# Patient Record
Sex: Female | Born: 1945 | Race: White | Hispanic: No | Marital: Married | Smoking: Never smoker
Health system: Southern US, Community
[De-identification: ages and names within clinical notes are randomized; demographics above are authoritative.]

## PROBLEM LIST (undated history)

## (undated) DIAGNOSIS — I499 Cardiac arrhythmia, unspecified: Secondary | ICD-10-CM

## (undated) DIAGNOSIS — K219 Gastro-esophageal reflux disease without esophagitis: Secondary | ICD-10-CM

## (undated) DIAGNOSIS — I10 Essential (primary) hypertension: Secondary | ICD-10-CM

## (undated) DIAGNOSIS — K589 Irritable bowel syndrome without diarrhea: Secondary | ICD-10-CM

## (undated) DIAGNOSIS — I2510 Atherosclerotic heart disease of native coronary artery without angina pectoris: Secondary | ICD-10-CM

## (undated) DIAGNOSIS — J189 Pneumonia, unspecified organism: Secondary | ICD-10-CM

## (undated) DIAGNOSIS — F32A Depression, unspecified: Secondary | ICD-10-CM

## (undated) DIAGNOSIS — C4491 Basal cell carcinoma of skin, unspecified: Secondary | ICD-10-CM

## (undated) DIAGNOSIS — F419 Anxiety disorder, unspecified: Secondary | ICD-10-CM

## (undated) DIAGNOSIS — M1990 Unspecified osteoarthritis, unspecified site: Secondary | ICD-10-CM

## (undated) DIAGNOSIS — H269 Unspecified cataract: Secondary | ICD-10-CM

## (undated) DIAGNOSIS — E785 Hyperlipidemia, unspecified: Secondary | ICD-10-CM

## (undated) DIAGNOSIS — E039 Hypothyroidism, unspecified: Secondary | ICD-10-CM

## (undated) DIAGNOSIS — F329 Major depressive disorder, single episode, unspecified: Secondary | ICD-10-CM

## (undated) DIAGNOSIS — Z8719 Personal history of other diseases of the digestive system: Secondary | ICD-10-CM

## (undated) HISTORY — PX: LACRIMAL DUCT PROBING: SHX1903

## (undated) HISTORY — DX: Basal cell carcinoma of skin, unspecified: C44.91

## (undated) HISTORY — PX: DENTAL SURGERY: SHX609

## (undated) HISTORY — DX: Irritable bowel syndrome, unspecified: K58.9

## (undated) HISTORY — PX: THYROIDECTOMY, PARTIAL: SHX18

## (undated) HISTORY — PX: BASAL CELL CARCINOMA EXCISION: SHX1214

## (undated) HISTORY — PX: COSMETIC SURGERY: SHX468

## (undated) HISTORY — DX: Depression, unspecified: F32.A

## (undated) HISTORY — PX: CHOLECYSTECTOMY: SHX55

## (undated) HISTORY — DX: Unspecified cataract: H26.9

## (undated) HISTORY — DX: Essential (primary) hypertension: I10

## (undated) HISTORY — DX: Hyperlipidemia, unspecified: E78.5

## (undated) HISTORY — DX: Anxiety disorder, unspecified: F41.9

## (undated) HISTORY — DX: Major depressive disorder, single episode, unspecified: F32.9

---

## 2007-12-03 ENCOUNTER — Emergency Department (HOSPITAL_COMMUNITY): Admission: EM | Admit: 2007-12-03 | Discharge: 2007-12-03 | Payer: Self-pay | Admitting: Emergency Medicine

## 2008-04-22 ENCOUNTER — Emergency Department (HOSPITAL_COMMUNITY): Admission: EM | Admit: 2008-04-22 | Discharge: 2008-04-22 | Payer: Self-pay | Admitting: Emergency Medicine

## 2009-10-24 ENCOUNTER — Encounter: Admission: RE | Admit: 2009-10-24 | Discharge: 2009-10-24 | Payer: Self-pay | Admitting: Internal Medicine

## 2009-11-03 ENCOUNTER — Encounter: Admission: RE | Admit: 2009-11-03 | Discharge: 2009-11-03 | Payer: Self-pay | Admitting: Family Medicine

## 2009-11-22 ENCOUNTER — Encounter: Admission: RE | Admit: 2009-11-22 | Discharge: 2009-11-22 | Payer: Self-pay | Admitting: Emergency Medicine

## 2009-11-22 ENCOUNTER — Other Ambulatory Visit: Admission: RE | Admit: 2009-11-22 | Discharge: 2009-11-22 | Payer: Self-pay | Admitting: Interventional Radiology

## 2009-11-30 ENCOUNTER — Encounter (INDEPENDENT_AMBULATORY_CARE_PROVIDER_SITE_OTHER): Payer: Self-pay | Admitting: *Deleted

## 2010-01-06 ENCOUNTER — Ambulatory Visit (HOSPITAL_COMMUNITY): Admission: RE | Admit: 2010-01-06 | Discharge: 2010-01-07 | Payer: Self-pay | Admitting: Otolaryngology

## 2010-01-06 ENCOUNTER — Encounter (INDEPENDENT_AMBULATORY_CARE_PROVIDER_SITE_OTHER): Payer: Self-pay | Admitting: Otolaryngology

## 2010-09-10 ENCOUNTER — Encounter: Payer: Self-pay | Admitting: Internal Medicine

## 2010-09-19 NOTE — Letter (Signed)
Summary: Referral - not able to see patient  [facility]  [address] [place], [place] [address]   Phone: [phone]  Fax: [phone]       November 30, 2009   [facility], [place] Ellamae Sia, M.D. [address] [place], [place] [address]    Re:   Alexis King DOB:  [birth year] MRN:   [id number]    Dear Dr. Merla Riches:  Thank you for your kind referral of the above patient.  We have attempted to schedule the recommended procedure Screening Colonoscopy but have not been able to schedule because:   X  The patient was not available by phone and/or has not returned our calls.  ___ The patient declined to schedule the procedure at this time.  We appreciate the referral and hope that we will have the opportunity to treat this patient in the future.    Sincerely,    [facility] [phone]

## 2010-11-06 LAB — CBC
HCT: 40.2 % (ref 36.0–46.0)
Hemoglobin: 13.9 g/dL (ref 12.0–15.0)
MCV: 93.8 fL (ref 78.0–100.0)
Platelets: 223 10*3/uL (ref 150–400)
RBC: 4.28 MIL/uL (ref 3.87–5.11)

## 2010-11-06 LAB — TSH: TSH: 4.841 u[IU]/mL — ABNORMAL HIGH (ref 0.350–4.500)

## 2010-11-06 LAB — COMPREHENSIVE METABOLIC PANEL WITH GFR
ALT: 28 U/L (ref 0–35)
AST: 28 U/L (ref 0–37)
Albumin: 4.2 g/dL (ref 3.5–5.2)
Alkaline Phosphatase: 46 U/L (ref 39–117)
BUN: 22 mg/dL (ref 6–23)
CO2: 28 meq/L (ref 19–32)
Calcium: 9.9 mg/dL (ref 8.4–10.5)
Chloride: 100 meq/L (ref 96–112)
Creatinine, Ser: 0.95 mg/dL (ref 0.4–1.2)
GFR calc Af Amer: >60 mL/min (ref >60)
GFR calc non Af Amer: 59 mL/min — ABNORMAL LOW (ref >60)
Glucose, Bld: 114 mg/dL — ABNORMAL HIGH (ref 70–99)
Potassium: 4.2 meq/L (ref 3.5–5.1)
Sodium: 138 meq/L (ref 135–145)
Total Bilirubin: 0.9 mg/dL (ref 0.3–1.2)
Total Protein: 7 g/dL (ref 6.0–8.3)

## 2010-11-06 LAB — GLUCOSE, CAPILLARY
Glucose-Capillary: 136 mg/dL — ABNORMAL HIGH (ref 70–99)
Glucose-Capillary: 174 mg/dL — ABNORMAL HIGH (ref 70–99)

## 2011-10-13 ENCOUNTER — Ambulatory Visit (INDEPENDENT_AMBULATORY_CARE_PROVIDER_SITE_OTHER): Payer: Medicare Other | Admitting: Family Medicine

## 2011-10-13 DIAGNOSIS — E789 Disorder of lipoprotein metabolism, unspecified: Secondary | ICD-10-CM

## 2011-10-13 DIAGNOSIS — E119 Type 2 diabetes mellitus without complications: Secondary | ICD-10-CM

## 2011-10-13 DIAGNOSIS — E785 Hyperlipidemia, unspecified: Secondary | ICD-10-CM

## 2011-10-13 DIAGNOSIS — I10 Essential (primary) hypertension: Secondary | ICD-10-CM

## 2011-10-13 DIAGNOSIS — E039 Hypothyroidism, unspecified: Secondary | ICD-10-CM

## 2011-10-13 LAB — POCT GLYCOSYLATED HEMOGLOBIN (HGB A1C): Hemoglobin A1C: 5.9

## 2011-10-13 NOTE — Progress Notes (Signed)
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 She comes in for a routine visit for diabetes and hypertension evaluation. She's had her eyes routinely followed by Dr. Bur[place]. She also needs her thyroid checked having had a thyroidectomy years past for multinodular goiter and is on complete thyroid replacement now. She's had diabetes for 20 years. Over the past year she's been on an Atkins loss 40 pounds then gained about 10 pounds back. She has occasional left shoulder pain otherwise no complaints.  Objective: Blood pressure 130/80, no acute distress, alert and friendly  Skin, including feet, unremarkable except for onycho-mycosis  HEENT: Unremarkable except for early cataract left eye  Fundus unremarkable  Neck unremarkable except for old thyroidectomy scar or rash chest clear  Heart regular no murmur or gallop.  Abdomen soft nontender mildly obese no HSM or masses  Extremities no edema, no lesions. Good pulses. Assessment: Stable  Plan: See med, TSH, lipids, A1c  If labs stable, refill all meds

## 2011-10-14 LAB — LIPID PANEL
Cholesterol: 218 mg/dL — ABNORMAL HIGH (ref 0–200)
HDL: 43 mg/dL (ref >39)
LDL Cholesterol: 114 mg/dL — ABNORMAL HIGH (ref 0–99)
Total CHOL/HDL Ratio: 5.1 Ratio
Triglycerides: 304 mg/dL — ABNORMAL HIGH (ref <150)
VLDL: 61 mg/dL — ABNORMAL HIGH (ref 0–40)

## 2011-10-14 LAB — COMPREHENSIVE METABOLIC PANEL
ALT: 20 U/L (ref 0–35)
AST: 26 U/L (ref 0–37)
Albumin: 4.6 g/dL (ref 3.5–5.2)
Alkaline Phosphatase: 45 U/L (ref 39–117)
BUN: 20 mg/dL (ref 6–23)
CO2: 30 mEq/L (ref 19–32)
Calcium: 10.2 mg/dL (ref 8.4–10.5)
Chloride: 103 mEq/L (ref 96–112)
Creat: 1.08 mg/dL (ref 0.50–1.10)
Glucose, Bld: 102 mg/dL — ABNORMAL HIGH (ref 70–99)
Potassium: 4.3 mEq/L (ref 3.5–5.3)
Sodium: 142 mEq/L (ref 135–145)
Total Bilirubin: 0.7 mg/dL (ref 0.3–1.2)
Total Protein: 7.2 g/dL (ref 6.0–8.3)

## 2011-10-14 LAB — TSH: TSH: 0.913 u[IU]/mL (ref 0.350–4.500)

## 2011-10-17 ENCOUNTER — Other Ambulatory Visit: Payer: Self-pay | Admitting: Family Medicine

## 2011-10-17 MED ORDER — LEVOTHYROXINE SODIUM 150 MCG PO TABS: 150.0000 ug | ORAL_TABLET | Freq: Every day | ORAL | Status: DC

## 2011-10-17 MED ORDER — LOSARTAN POTASSIUM-HCTZ 100-12.5 MG PO TABS: 1.0000 | ORAL_TABLET | Freq: Every day | ORAL | Status: DC

## 2011-10-17 NOTE — Progress Notes (Signed)
I ordered the losartan and the synthroid for a year but they printed. I left them to be faxed when we know which pharmacy to fax them to

## 2011-10-18 NOTE — Progress Notes (Signed)
LMOM for patent to CB.

## 2011-10-18 NOTE — Progress Notes (Signed)
Pt CB and also needs her Metformin ER 500 mg, two tab QD called in. Called in metformin #60 with 5 RFs and faxed Rxs for synthroid and losartan to [facility] outpt pharm

## 2011-10-24 ENCOUNTER — Other Ambulatory Visit: Payer: Self-pay | Admitting: Family Medicine

## 2011-10-24 MED ORDER — METFORMIN HCL ER 500 MG PO TB24: 500.0000 mg | ORAL_TABLET | Freq: Every day | ORAL | Status: DC

## 2011-11-13 ENCOUNTER — Other Ambulatory Visit: Payer: Self-pay | Admitting: Internal Medicine

## 2011-11-26 ENCOUNTER — Other Ambulatory Visit: Payer: Self-pay | Admitting: Physician Assistant

## 2012-02-25 ENCOUNTER — Other Ambulatory Visit: Payer: Self-pay | Admitting: Physician Assistant

## 2012-02-26 ENCOUNTER — Other Ambulatory Visit: Payer: Self-pay | Admitting: Physician Assistant

## 2012-03-31 ENCOUNTER — Ambulatory Visit (INDEPENDENT_AMBULATORY_CARE_PROVIDER_SITE_OTHER): Payer: Medicare Other | Admitting: Family Medicine

## 2012-03-31 VITALS — BP 124/94 | HR 81 | Temp 98.7°F | Resp 18 | Ht 62.0 in | Wt 221.0 lb

## 2012-03-31 DIAGNOSIS — H9209 Otalgia, unspecified ear: Secondary | ICD-10-CM

## 2012-03-31 DIAGNOSIS — R0982 Postnasal drip: Secondary | ICD-10-CM

## 2012-03-31 DIAGNOSIS — J329 Chronic sinusitis, unspecified: Secondary | ICD-10-CM

## 2012-03-31 MED ORDER — AMOXICILLIN 875 MG PO TABS: 875.0000 mg | ORAL_TABLET | Freq: Two times a day (BID) | ORAL | Status: AC

## 2012-03-31 NOTE — Progress Notes (Signed)
[facility]:  Office Visit  Chief Complaint:  Chief Complaint  Patient presents with  . Otitis Media    HPI: Alexis King is a 66 y.o. female who complains of  Right ear pain x 2 days. + sore throat. Deneis fevers, chills. + h/o sinusitis and is taking zyrtec  Past Medical History  Diagnosis Date  . Hypertension   . Hyperlipidemia   . Depression   . Diabetes mellitus   . IBS (irritable bowel syndrome)    Past Surgical History  Procedure Date  . Cholecystectomy   . Cesarean section   . Lacrimal duct probing    History   Social History  . Marital Status: Married    Spouse Name: N/A    Number of Children: N/A  . Years of Education: N/A   Social History Main Topics  . Smoking status: Never Smoker   . Smokeless tobacco: None  . Alcohol Use: No  . Drug Use: No  . Sexually Active: None   Other Topics Concern  . None   Social History Narrative  . None   Family History  Problem Relation Age of Onset  . Heart disease Father    Allergies  Allergen Reactions  . Ace Inhibitors Cough  . Sulfa Antibiotics Rash   Prior to Admission medications   Medication Sig Start Date End Date Taking? Authorizing Provider  aspirin 81 MG tablet Take 81 mg by mouth daily.   Yes Historical Provider, MD  buPROPion (WELLBUTRIN SR) 150 MG 12 hr tablet TAKE 1 TABLET BY MOUTH TWICE DAILY 11/26/11  Yes Morrell Riddle, PA-C  buPROPion (WELLBUTRIN XL) 150 MG 24 hr tablet Take 150 mg by mouth 2 (two) times daily.   Yes Historical Provider, MD  cetirizine (ZYRTEC) 10 MG tablet Take 10 mg by mouth at bedtime.   Yes Historical Provider, MD  Boris Lown Oil 500 MG CAPS Take 500 mg by mouth 2 (two) times daily.   Yes Historical Provider, MD  levothyroxine (SYNTHROID, LEVOTHROID) 150 MCG tablet Take 1 tablet (150 mcg total) by mouth daily. 10/17/11  Yes Elvina Sidle, MD  losartan-hydrochlorothiazide (HYZAAR) 100-12.5 MG per tablet Take 1 tablet by mouth daily. 10/17/11  Yes Elvina Sidle, MD  magnesium 30 MG tablet Take 30 mg by mouth daily.   Yes Historical Provider, MD  metFORMIN (GLUCOPHAGE-XR) 500 MG 24 hr tablet Take 1 tablet (500 mg total) by mouth at bedtime. 10/24/11  Yes Elvina Sidle, MD  Multiple Vitamins-Minerals (MULTIVITAMIN WITH MINERALS) tablet Take 1 tablet by mouth daily.   Yes Historical Provider, MD  naproxen (NAPROSYN) 250 MG tablet Take 400 mg by mouth 2 (two) times daily.   Yes Historical Provider, MD  pravastatin (PRAVACHOL) 40 MG tablet TAKE 1 TABLET BY MOUTH AT BEDTIME 02/25/12  Yes Pattricia Boss, PA-C  sertraline (ZOLOFT) 100 MG tablet Take 200 mg by mouth daily.   Yes Historical Provider, MD  vitamin C (ASCORBIC ACID) 500 MG tablet Take 500 mg by mouth daily.   Yes Historical Provider, MD  amoxicillin (AMOXIL) 875 MG tablet Take 1 tablet (875 mg total) by mouth 2 (two) times daily. 03/31/12 04/10/12  Nial Hawe P Macaela Presas, DO  clonazePAM (KLONOPIN) 0.5 MG tablet Take 1 mg by mouth at bedtime as needed.    Historical Provider, MD     ROS: The patient denies fevers, chills, night sweats, unintentional weight loss, chest pain, palpitations, wheezing, dyspnea on exertion, nausea, vomiting, abdominal pain, dysuria, hematuria, melena,  numbness, weakness, or tingling.   All other systems have been reviewed and were otherwise negative with the exception of those mentioned in the HPI and as above.    PHYSICAL EXAM: Filed Vitals:   03/31/12 1537  BP: 124/94  Pulse: 81  Temp: 98.7 F (37.1 C)  Resp: 18   Filed Vitals:   03/31/12 1537  Height: 5\' 2"  (1.575 m)  Weight: 221 lb (100.245 kg)   Body mass index is 40.42 kg/(m^2).  General: Alert, no acute distress. Obese HEENT:  Normocephalic, atraumatic, oropharynx patent. EOMI, PERRLA, erythematous OP, no exudates. Right TM bulging but not red Cardiovascular:  Regular rate and rhythm, no rubs murmurs or gallops.  No Carotid bruits, radial pulse intact. No pedal edema.  Respiratory: Clear to auscultation  bilaterally.  No wheezes, rales, or rhonchi.  No cyanosis, no use of accessory musculature GI: No organomegaly, abdomen is soft and non-tender, positive bowel sounds.  No masses. Skin: No rashes. Neurologic: Facial musculature symmetric. Psychiatric: Patient is appropriate throughout our interaction. Lymphatic: No cervical lymphadenopathy Musculoskeletal: Gait intact.   LABS: Results for orders placed in visit on 10/13/11  LIPID PANEL      Component Value Range   Cholesterol 218 (*) 0 - 200 mg/dL   Triglycerides 981 (*) <150 mg/dL   HDL 43  >19 mg/dL   Total CHOL/HDL Ratio 5.1     VLDL 61 (*) 0 - 40 mg/dL   LDL Cholesterol 147 (*) 0 - 99 mg/dL  TSH      Component Value Range   TSH 0.913  0.350 - 4.500 uIU/mL  COMPREHENSIVE METABOLIC PANEL      Component Value Range   Sodium 142  135 - 145 mEq/L   Potassium 4.3  3.5 - 5.3 mEq/L   Chloride 103  96 - 112 mEq/L   CO2 30  19 - 32 mEq/L   Glucose, Bld 102 (*) 70 - 99 mg/dL   BUN 20  6 - 23 mg/dL   Creat 8.29  5.62 - 1.30 mg/dL   Total Bilirubin 0.7  0.3 - 1.2 mg/dL   Alkaline Phosphatase 45  39 - 117 U/L   AST 26  0 - 37 U/L   ALT 20  0 - 35 U/L   Total Protein 7.2  6.0 - 8.3 g/dL   Albumin 4.6  3.5 - 5.2 g/dL   Calcium 86.5  8.4 - 78.4 mg/dL  POCT GLYCOSYLATED HEMOGLOBIN (HGB A1C)      Component Value Range   Hemoglobin A1C 5.9       EKG/XRAY:   Primary read interpreted by Dr. Conley Rolls at [facility].   ASSESSMENT/PLAN: Encounter Diagnoses  Name Primary?  . Otalgia Yes  . Sinusitis   . Post-nasal drip    Sxs treatment first. If no improvement then take Amoxacillin 875 mg BID x 10 days F/u prn    Masud Holub PHUONG, DO 03/31/2012 4:03 PM

## 2012-06-09 ENCOUNTER — Ambulatory Visit (INDEPENDENT_AMBULATORY_CARE_PROVIDER_SITE_OTHER): Payer: Medicare Other | Admitting: Emergency Medicine

## 2012-06-09 VITALS — BP 120/76 | HR 74 | Temp 98.0°F | Resp 18

## 2012-06-09 DIAGNOSIS — M5430 Sciatica, unspecified side: Secondary | ICD-10-CM

## 2012-06-09 MED ORDER — HYDROCODONE-ACETAMINOPHEN 5-325 MG PO TABS
1.0000 | ORAL_TABLET | ORAL | Status: AC | PRN
Start: 2012-06-09 — End: 2012-06-19

## 2012-06-09 NOTE — Progress Notes (Signed)
[facility] [address] [place] [address] [phone]  Date:  06/09/2012   Name:  Alexis King   DOB:  [birth year]   MRN:  [id number]  PCP:  No primary provider on file.    Chief Complaint: Back Pain   History of Present Illness:  Alexis King is a 66 y.o. very pleasant female patient who presents with the following:  History of low back pain radiating into right leg.  Most recent episode in June that passed promptly with medication.  Now has episode of right low back pain radiating into right leg associated with numbness and weakness in right lower leg started 1 1/2 weeks ago.  No history of trauma or overuse.  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  Never had MRI.  Has been taking vicodin for pain that was prescribed for prior episode of pain.  There is no problem list on file for this patient.   Past Medical History  Diagnosis Date  . Hypertension   . Hyperlipidemia   . Depression   . Diabetes mellitus   . IBS (irritable bowel syndrome)     Past Surgical History  Procedure Date  . Cholecystectomy   . Cesarean section   . Lacrimal duct probing     History  Substance Use Topics  . Smoking status: Never Smoker   . Smokeless tobacco: Not on file  . Alcohol Use: No    Family History  Problem Relation Age of Onset  . Heart disease Father     Allergies  Allergen Reactions  . Ace Inhibitors Cough  . Sulfa Antibiotics Rash    Medication list has been reviewed and updated.  Current Outpatient Prescriptions on File Prior to Visit  Medication Sig Dispense Refill  . aspirin 81 MG tablet Take 81 mg by mouth daily.      Marland Kitchen buPROPion (WELLBUTRIN SR) 150 MG 12 hr tablet TAKE 1 TABLET BY MOUTH TWICE DAILY  180 tablet  PRN  . cetirizine (ZYRTEC) 10 MG tablet Take 10 mg by mouth at bedtime.      Boris Lown Oil 500 MG CAPS Take 500 mg by mouth 2 (two) times daily.      Marland Kitchen levothyroxine (SYNTHROID, LEVOTHROID) 150 MCG tablet Take  1 tablet (150 mcg total) by mouth daily.  90 tablet  3  . losartan-hydrochlorothiazide (HYZAAR) 100-12.5 MG per tablet Take 1 tablet by mouth daily.  90 tablet  3  . magnesium 30 MG tablet Take 30 mg by mouth daily.      . metFORMIN (GLUCOPHAGE-XR) 500 MG 24 hr tablet Take 1 tablet (500 mg total) by mouth at bedtime.  90 tablet  3  . Multiple Vitamins-Minerals (MULTIVITAMIN WITH MINERALS) tablet Take 1 tablet by mouth daily.      . naproxen (NAPROSYN) 250 MG tablet Take 400 mg by mouth 2 (two) times daily.      . pravastatin (PRAVACHOL) 40 MG tablet TAKE 1 TABLET BY MOUTH AT BEDTIME  90 tablet  0  . sertraline (ZOLOFT) 100 MG tablet Take 200 mg by mouth daily.      Marland Kitchen buPROPion (WELLBUTRIN XL) 150 MG 24 hr tablet Take 150 mg by mouth 2 (two) times daily.      . clonazePAM (KLONOPIN) 0.5 MG tablet Take 1 mg by mouth at bedtime as needed.      . vitamin C (ASCORBIC ACID) 500 MG tablet Take 500 mg by mouth daily.  Review of Systems:  As per HPI, otherwise negative.    Physical Examination: Filed Vitals:   06/09/12 1231  BP: 120/76  Pulse: 74  Temp: 98 F (36.7 C)  Resp: 18   There were no vitals filed for this visit. There is no height or weight on file to calculate BMI. Ideal Body Weight:     GEN: WDWN, NAD, Non-toxic, Alert & Oriented x 3 HEENT: Atraumatic, Normocephalic.  Ears and Nose: No external deformity. EXTR: No clubbing/cyanosis/edema NEURO: Antalgic gait favoring right leg.  PSYCH: Normally interactive. Conversant. Not depressed or anxious appearing.  Calm demeanor.  BACK:  Tender right sciatic notch.  Neuro grossly intact with minimal weakness in right leg  Assessment and Plan: Sciatic neuritis MRI Hydrocodone Follow up after MRI  Carmelina Dane, MD  I have reviewed and agree with documentation. Robert P. Merla Riches, M.D.

## 2012-06-10 ENCOUNTER — Other Ambulatory Visit: Payer: Self-pay | Admitting: Physician Assistant

## 2012-06-13 ENCOUNTER — Ambulatory Visit
Admission: RE | Admit: 2012-06-13 | Discharge: 2012-06-13 | Disposition: A | Discharge status: Home / Self Care | Payer: Self-pay | Source: Ambulatory Visit | Attending: Emergency Medicine | Admitting: Emergency Medicine

## 2012-06-13 DIAGNOSIS — M5430 Sciatica, unspecified side: Secondary | ICD-10-CM

## 2012-06-19 ENCOUNTER — Telehealth: Payer: Self-pay | Admitting: Radiology

## 2012-06-19 DIAGNOSIS — M47816 Spondylosis without myelopathy or radiculopathy, lumbar region: Secondary | ICD-10-CM

## 2012-06-19 NOTE — Telephone Encounter (Signed)
I have advised patient of MRI results and have put in referral for Neurosurgeon.

## 2012-10-10 ENCOUNTER — Telehealth: Payer: Self-pay

## 2012-10-10 NOTE — Telephone Encounter (Signed)
Patient states she is out her synthroid and her pharmacy has sent requests already we have not responded. They did give her a 3 day supply to get her through the weekend but she needs more. She does have an appointment scheduled with Dr. Merla Riches for a CPE on March 5.  Please call her back on home # at [phone].

## 2012-10-11 MED ORDER — LEVOTHYROXINE SODIUM 150 MCG PO TABS: 150.0000 ug | ORAL_TABLET | Freq: Every day | ORAL | Status: DC

## 2012-10-11 NOTE — Telephone Encounter (Signed)
Pt notified and rx sent in 

## 2012-10-17 ENCOUNTER — Other Ambulatory Visit: Payer: Self-pay | Admitting: Family Medicine

## 2012-10-22 ENCOUNTER — Ambulatory Visit (INDEPENDENT_AMBULATORY_CARE_PROVIDER_SITE_OTHER): Payer: Medicare Other | Admitting: Internal Medicine

## 2012-10-22 ENCOUNTER — Encounter: Payer: Self-pay | Admitting: Internal Medicine

## 2012-10-22 VITALS — BP 126/72 | HR 90 | Temp 97.0°F | Resp 16 | Ht 62.0 in | Wt 225.0 lb

## 2012-10-22 DIAGNOSIS — E039 Hypothyroidism, unspecified: Secondary | ICD-10-CM | POA: Insufficient documentation

## 2012-10-22 DIAGNOSIS — E785 Hyperlipidemia, unspecified: Secondary | ICD-10-CM | POA: Insufficient documentation

## 2012-10-22 DIAGNOSIS — Z6841 Body Mass Index (BMI) 40.0 and over, adult: Secondary | ICD-10-CM

## 2012-10-22 DIAGNOSIS — I10 Essential (primary) hypertension: Secondary | ICD-10-CM

## 2012-10-22 DIAGNOSIS — E042 Nontoxic multinodular goiter: Secondary | ICD-10-CM

## 2012-10-22 DIAGNOSIS — M4716 Other spondylosis with myelopathy, lumbar region: Secondary | ICD-10-CM

## 2012-10-22 DIAGNOSIS — F329 Major depressive disorder, single episode, unspecified: Secondary | ICD-10-CM | POA: Insufficient documentation

## 2012-10-22 DIAGNOSIS — F32A Depression, unspecified: Secondary | ICD-10-CM

## 2012-10-22 DIAGNOSIS — E119 Type 2 diabetes mellitus without complications: Secondary | ICD-10-CM

## 2012-10-22 DIAGNOSIS — Z0000 Encounter for general adult medical examination without abnormal findings: Secondary | ICD-10-CM

## 2012-10-22 LAB — COMPREHENSIVE METABOLIC PANEL
ALT: 20 U/L (ref 0–35)
AST: 22 U/L (ref 0–37)
Albumin: 4.8 g/dL (ref 3.5–5.2)
CO2: 27 mEq/L (ref 19–32)
Calcium: 9.6 mg/dL (ref 8.4–10.5)
Chloride: 104 mEq/L (ref 96–112)
Potassium: 4.3 mEq/L (ref 3.5–5.3)
Total Protein: 7.5 g/dL (ref 6.0–8.3)

## 2012-10-22 LAB — CBC WITH DIFFERENTIAL/PLATELET
Basophils Absolute: 0 10*3/uL (ref 0.0–0.1)
HCT: 42.8 % (ref 36.0–46.0)
Lymphocytes Relative: 24 % (ref 12–46)
Lymphs Abs: 1.6 10*3/uL (ref 0.7–4.0)
Neutro Abs: 4.2 10*3/uL (ref 1.7–7.7)
Platelets: 239 10*3/uL (ref 150–400)
RBC: 4.79 MIL/uL (ref 3.87–5.11)
RDW: 14 % (ref 11.5–15.5)
WBC: 6.5 10*3/uL (ref 4.0–10.5)

## 2012-10-22 LAB — TSH: TSH: 2.943 u[IU]/mL (ref 0.350–4.500)

## 2012-10-22 LAB — T4, FREE: Free T4: 1.58 ng/dL (ref 0.80–1.80)

## 2012-10-22 MED ORDER — SERTRALINE HCL 100 MG PO TABS: 100.0000 mg | ORAL_TABLET | Freq: Every day | ORAL | Status: DC

## 2012-10-22 MED ORDER — FLUTICASONE PROPIONATE 50 MCG/ACT NA SUSP: 2.0000 | Freq: Every day | NASAL | Status: DC

## 2012-10-22 MED ORDER — METFORMIN HCL ER 500 MG PO TB24: 500.0000 mg | ORAL_TABLET | Freq: Every day | ORAL | Status: DC

## 2012-10-22 MED ORDER — LOSARTAN POTASSIUM-HCTZ 100-12.5 MG PO TABS: 1.0000 | ORAL_TABLET | Freq: Every day | ORAL | Status: DC

## 2012-10-22 MED ORDER — BUPROPION HCL ER (XL) 300 MG PO TB24: 300.0000 mg | ORAL_TABLET | Freq: Every day | ORAL | Status: DC

## 2012-10-22 MED ORDER — CLONAZEPAM 0.5 MG PO TABS: 1.0000 mg | ORAL_TABLET | Freq: Every evening | ORAL | Status: DC | PRN

## 2012-10-23 LAB — HEMOGLOBIN A1C
Hgb A1c MFr Bld: 6.7 % — ABNORMAL HIGH (ref <5.7)
Mean Plasma Glucose: 146 mg/dL — ABNORMAL HIGH (ref <117)

## 2012-10-24 ENCOUNTER — Encounter: Payer: Self-pay | Admitting: Internal Medicine

## 2012-10-24 NOTE — Progress Notes (Signed)
Subjective:    Patient ID: Alexis King, female    DOB: [birth year], 67 y.o.   MRN: [id number]  HPIannual exam Patient Active Problem List  Diagnosis  . HTN (hypertension)  . Other and unspecified hyperlipidemia  . Depression  . Multiple thyroid nodules  . BMI 40.0-44.9, adult  . Unspecified hypothyroidism  . DM (diabetes mellitus)  . Lumbar spondylosis with myelopathy  Current outpatient prescriptions:aspirin 81 MG tablet, Take 81 mg by mouth daily., Disp: , Rfl: ;  Biotin 10 MG TABS, Take 1 tablet by mouth daily., Disp: , Rfl: ;  cetirizine (ZYRTEC) 10 MG tablet, Take 10 mg by mouth at bedtime., Disp: , Rfl: ;  cholecalciferol (VITAMIN D) 1000 UNITS tablet, Take 2,000 Units by mouth daily., Disp: , Rfl: ;  fish oil-omega-3 fatty acids 1000 MG capsule, Take 1 g by mouth daily., Disp: , Rfl:  levothyroxine (SYNTHROID, LEVOTHROID) 150 MCG tablet, Take 1 tablet (150 mcg total) by mouth daily. Needs office visit/labs, Disp: 30 tablet, Rfl: 0;  losartan-hydrochlorothiazide (HYZAAR) 100-12.5 MG per tablet, Take 1 tablet by mouth daily., Disp: 90 tablet, Rfl: 3;  magnesium 30 MG tablet, Take 30 mg by mouth daily., Disp: , Rfl:  metFORMIN (GLUCOPHAGE-XR) 500 MG 24 hr tablet, Take 1 tablet (500 mg total) by mouth at bedtime., Disp: 90 tablet, Rfl: 3;  Multiple Vitamins-Minerals (MULTIVITAMIN WITH MINERALS) tablet, Take 1 tablet by mouth daily., Disp: , Rfl: ;  naproxen (NAPROSYN) 250 MG tablet, Take 400 mg by mouth 2 (two) times daily., Disp: , Rfl: ;  pravastatin (PRAVACHOL) 40 MG tablet, TAKE 1 TABLET BY MOUTH AT BEDTIME, Disp: 90 tablet, Rfl: PRN sertraline (ZOLOFT) 100 MG tablet, Take 1 tablet (100 mg total) by mouth daily., Disp: 90 tablet, Rfl: 3;  vitamin C (ASCORBIC ACID) 500 MG tablet, Take 500 mg by mouth daily., Disp: , Rfl: ;  buPROPion (WELLBUTRIN XL) 300 MG 24 hr tablet, Take 1 tablet (300 mg total) by mouth daily., Disp: 90 tablet, Rfl: 3;  clonazePAM (KLONOPIN) 0.5 MG tablet, Take 2  tablets (1 mg total) by mouth at bedtime as needed., Disp: 30 tablet, Rfl: 5 fluticasone (FLONASE) 50 MCG/ACT nasal spray, Place 2 sprays into the nose daily., Disp: 16 g, Rfl: 6;  Krill Oil 500 MG CAPS, Take 500 mg by mouth 2 (two) times daily., Disp: , Rfl:   No big changes in health status this year. Husband recently diagnosed with MS and taking care of him it is a struggle. He is beginning to have memory issues. She is now working part-time as a Engineer, civil (consulting) at [employer], and this is easier than her full-time schedule. She has had neck pain which is being evaluated by Dr. Viann Fish hopes to avoid surgery.  Last Pap 2012 normal/mammogram is up-to-date/immunizations up-to-date  Depression and anxiety stable on medications.    Review of Systems  Constitutional: Negative for fever, activity change, appetite change, fatigue and unexpected weight change.       Continues to be overweight  HENT: Positive for congestion, rhinorrhea, neck pain, neck stiffness, postnasal drip and sinus pressure. Negative for hearing loss, ear pain, trouble swallowing, dental problem and voice change.        On medications for allergies  Respiratory: Positive for cough. Negative for chest tightness, shortness of breath and wheezing.        Cough associated with postnasal drip  Cardiovascular: Positive for palpitations. Negative for chest pain and leg swelling.       Palpitations not associated  with any other symptom and not affecting activity level  Gastrointestinal: Negative for nausea, vomiting, abdominal pain, diarrhea, constipation and blood in stool.  Endocrine: Negative for cold intolerance.  Genitourinary: Positive for urgency and frequency. Negative for dysuria, hematuria and difficulty urinating.  Musculoskeletal: Positive for back pain and arthralgias. Negative for gait problem.       Often uses Naprosyn/have been a reaction to meloxicam= right upper quadrant pain  Skin: Negative for rash.  Allergic/Immunologic:  Positive for environmental allergies. Negative for immunocompromised state.  Neurological: Negative for dizziness, tremors, weakness and headaches.  Hematological: Negative for adenopathy. Bruises/bleeds easily.  Psychiatric/Behavioral: Positive for dysphoric mood. Negative for suicidal ideas, behavioral problems, confusion, sleep disturbance, self-injury and agitation. The patient is nervous/anxious.        Objective:   Physical Exam  Vitals reviewed. Constitutional: She is oriented to person, place, and time. No distress.  Overweight  HENT:  Right Ear: External ear normal.  Left Ear: External ear normal.  Mouth/Throat: Oropharynx is clear and moist.  Boggy turbinates  Eyes: Conjunctivae and EOM are normal. Pupils are equal, round, and reactive to light.  Neck: No JVD present. No thyromegaly present.  Decreased range of motion due to discomfort  Cardiovascular: Normal rate, regular rhythm, normal heart sounds and intact distal pulses.  Exam reveals no gallop.   No murmur heard. Pulmonary/Chest: Effort normal and breath sounds normal. No respiratory distress. She has no wheezes. She has no rales. She exhibits no tenderness.  Abdominal: Soft. Bowel sounds are normal. She exhibits no mass. There is no tenderness. There is no rebound and no guarding.  Musculoskeletal: Normal range of motion. She exhibits no edema and no tenderness.  Lymphadenopathy:    She has no cervical adenopathy.  Neurological: She is alert and oriented to person, place, and time. She has normal reflexes.  Skin: No rash noted.  Psychiatric: She has a normal mood and affect. Her behavior is normal. Judgment and thought content normal.          Assessment & Plan:  Annual exam Routine labs as well as labs for multiple problems  Results for orders placed in visit on 10/22/12  CBC WITH DIFFERENTIAL      Result Value Range   WBC 6.5  4.0 - 10.5 K/uL   RBC 4.79  3.87 - 5.11 MIL/uL   Hemoglobin 15.1 (*) 12.0 -  15.0 g/dL   HCT 16.1  09.6 - 04.5 %   MCV 89.4  78.0 - 100.0 fL   MCH 31.5  26.0 - 34.0 pg   MCHC 35.3  30.0 - 36.0 g/dL   RDW 40.9  81.1 - 91.4 %   Platelets 239  150 - 400 K/uL   Neutrophils Relative 64  43 - 77 %   Neutro Abs 4.2  1.7 - 7.7 K/uL   Lymphocytes Relative 24  12 - 46 %   Lymphs Abs 1.6  0.7 - 4.0 K/uL   Monocytes Relative 6  3 - 12 %   Monocytes Absolute 0.4  0.1 - 1.0 K/uL   Eosinophils Relative 6 (*) 0 - 5 %   Eosinophils Absolute 0.4  0.0 - 0.7 K/uL   Basophils Relative 0  0 - 1 %   Basophils Absolute 0.0  0.0 - 0.1 K/uL   Smear Review Criteria for review not met    COMPREHENSIVE METABOLIC PANEL      Result Value Range   Sodium 140  135 - 145 mEq/L  Potassium 4.3  3.5 - 5.3 mEq/L   Chloride 104  96 - 112 mEq/L   CO2 27  19 - 32 mEq/L   Glucose, Bld 112 (*) 70 - 99 mg/dL   BUN 12  6 - 23 mg/dL   Creat 1.61  0.96 - 0.45 mg/dL   Total Bilirubin 0.6  0.3 - 1.2 mg/dL   Alkaline Phosphatase 40  39 - 117 U/L   AST 22  0 - 37 U/L   ALT 20  0 - 35 U/L   Total Protein 7.5  6.0 - 8.3 g/dL   Albumin 4.8  3.5 - 5.2 g/dL   Calcium 9.6  8.4 - 40.9 mg/dL  LIPID PANEL      Result Value Range   Cholesterol 202 (*) 0 - 200 mg/dL   Triglycerides 811 (*) <150 mg/dL   HDL 45  >91 mg/dL   Total CHOL/HDL Ratio 4.5     VLDL 51 (*) 0 - 40 mg/dL   LDL Cholesterol 478 (*) 0 - 99 mg/dL  TSH      Result Value Range   TSH 2.943  0.350 - 4.500 uIU/mL  T4, FREE      Result Value Range   Free T4 1.58  0.80 - 1.80 ng/dL  HEMOGLOBIN G9F      Result Value Range   Hemoglobin A1C 6.7 (*) <5.7 %   Mean Plasma Glucose 146 (*) <117 mg/dL   Meds ordered this encounter  Medications  . cholecalciferol (VITAMIN D) 1000 UNITS tablet    Sig: Take 2,000 Units by mouth daily.  . fish oil-omega-3 fatty acids 1000 MG capsule    Sig: Take 1 g by mouth daily.  . Biotin 10 MG TABS    Sig: Take 1 tablet by mouth daily.  Marland Kitchen buPROPion (WELLBUTRIN XL) 300 MG 24 hr tablet    Sig: Take 1 tablet  (300 mg total) by mouth daily.    Dispense:  90 tablet    Refill:  3  . sertraline (ZOLOFT) 100 MG tablet    Sig: Take 1 tablet (100 mg total) by mouth daily.    Dispense:  90 tablet    Refill:  3  . clonazePAM (KLONOPIN) 0.5 MG tablet    Sig: Take 2 tablets (1 mg total) by mouth at bedtime as needed.    Dispense:  30 tablet    Refill:  5  . losartan-hydrochlorothiazide (HYZAAR) 100-12.5 MG per tablet    Sig: Take 1 tablet by mouth daily.    Dispense:  90 tablet    Refill:  3  . metFORMIN (GLUCOPHAGE-XR) 500 MG 24 hr tablet    Sig: Take 1 tablet (500 mg total) by mouth at bedtime.    Dispense:  90 tablet    Refill:  3  . fluticasone (FLONASE) 50 MCG/ACT nasal spray    Sig: Place 2 sprays into the nose daily.    Dispense:  16 g    Refill:  6   Flonase added to try to resolve cough with postnasal drip and congestion at night Followup 6 months

## 2012-11-10 ENCOUNTER — Other Ambulatory Visit: Payer: Self-pay | Admitting: Internal Medicine

## 2012-12-02 ENCOUNTER — Other Ambulatory Visit: Payer: Self-pay

## 2012-12-02 DIAGNOSIS — I10 Essential (primary) hypertension: Secondary | ICD-10-CM

## 2012-12-02 DIAGNOSIS — E119 Type 2 diabetes mellitus without complications: Secondary | ICD-10-CM

## 2012-12-02 MED ORDER — METFORMIN HCL ER 500 MG PO TB24: 500.0000 mg | ORAL_TABLET | Freq: Every day | ORAL | Status: DC

## 2012-12-02 MED ORDER — LEVOTHYROXINE SODIUM 150 MCG PO TABS: 150.0000 ug | ORAL_TABLET | Freq: Every day | ORAL | Status: DC

## 2012-12-02 MED ORDER — LOSARTAN POTASSIUM-HCTZ 100-12.5 MG PO TABS: 1.0000 | ORAL_TABLET | Freq: Every day | ORAL | Status: DC

## 2012-12-02 MED ORDER — SERTRALINE HCL 100 MG PO TABS: 100.0000 mg | ORAL_TABLET | Freq: Every day | ORAL | Status: DC

## 2012-12-02 MED ORDER — BUPROPION HCL ER (XL) 300 MG PO TB24: 300.0000 mg | ORAL_TABLET | Freq: Every day | ORAL | Status: DC

## 2012-12-02 MED ORDER — PRAVASTATIN SODIUM 40 MG PO TABS: ORAL_TABLET | ORAL | Status: DC

## 2012-12-05 ENCOUNTER — Other Ambulatory Visit: Payer: Self-pay

## 2012-12-05 MED ORDER — PRAVASTATIN SODIUM 40 MG PO TABS: ORAL_TABLET | ORAL | Status: DC

## 2012-12-18 ENCOUNTER — Other Ambulatory Visit: Payer: Self-pay

## 2012-12-18 DIAGNOSIS — E119 Type 2 diabetes mellitus without complications: Secondary | ICD-10-CM

## 2012-12-18 DIAGNOSIS — I10 Essential (primary) hypertension: Secondary | ICD-10-CM

## 2012-12-18 MED ORDER — METFORMIN HCL ER 500 MG PO TB24: 500.0000 mg | ORAL_TABLET | Freq: Every day | ORAL | Status: DC

## 2012-12-18 MED ORDER — FLUTICASONE PROPIONATE 50 MCG/ACT NA SUSP: 2.0000 | Freq: Every day | NASAL | Status: DC

## 2012-12-18 MED ORDER — LOSARTAN POTASSIUM-HCTZ 100-12.5 MG PO TABS: 1.0000 | ORAL_TABLET | Freq: Every day | ORAL | Status: DC

## 2012-12-18 MED ORDER — BUPROPION HCL ER (XL) 300 MG PO TB24: 300.0000 mg | ORAL_TABLET | Freq: Every day | ORAL | Status: DC

## 2013-03-24 ENCOUNTER — Other Ambulatory Visit: Payer: Self-pay

## 2013-03-24 MED ORDER — PRAVASTATIN SODIUM 40 MG PO TABS: ORAL_TABLET | ORAL | Status: DC

## 2013-03-24 MED ORDER — LEVOTHYROXINE SODIUM 150 MCG PO TABS: 150.0000 ug | ORAL_TABLET | Freq: Every day | ORAL | Status: DC

## 2013-04-21 ENCOUNTER — Other Ambulatory Visit: Payer: Self-pay | Admitting: Internal Medicine

## 2013-06-25 ENCOUNTER — Other Ambulatory Visit: Payer: Self-pay | Admitting: Internal Medicine

## 2013-07-03 ENCOUNTER — Ambulatory Visit (INDEPENDENT_AMBULATORY_CARE_PROVIDER_SITE_OTHER): Payer: Medicare Other | Admitting: Family Medicine

## 2013-07-03 ENCOUNTER — Ambulatory Visit
Admission: RE | Admit: 2013-07-03 | Discharge: 2013-07-03 | Disposition: A | Discharge status: Home / Self Care | Payer: Medicare Other | Source: Ambulatory Visit | Attending: Family Medicine | Admitting: Family Medicine

## 2013-07-03 VITALS — BP 132/80 | HR 72 | Temp 97.5°F | Resp 16 | Ht 62.0 in | Wt 232.6 lb

## 2013-07-03 DIAGNOSIS — R51 Headache: Secondary | ICD-10-CM

## 2013-07-03 DIAGNOSIS — E119 Type 2 diabetes mellitus without complications: Secondary | ICD-10-CM

## 2013-07-03 DIAGNOSIS — F32A Depression, unspecified: Secondary | ICD-10-CM

## 2013-07-03 DIAGNOSIS — F329 Major depressive disorder, single episode, unspecified: Secondary | ICD-10-CM

## 2013-07-03 DIAGNOSIS — IMO0001 Reserved for inherently not codable concepts without codable children: Secondary | ICD-10-CM

## 2013-07-03 LAB — POCT CBC
Granulocyte percent: 69.1 %G (ref 37–80)
HCT, POC: 48.9 % — AB (ref 37.7–47.9)
Lymph, poc: 1.6 (ref 0.6–3.4)
MCHC: 32.1 g/dL (ref 31.8–35.4)
MCV: 101 fL — AB (ref 80–97)
RBC: 4.84 M/uL (ref 4.04–5.48)

## 2013-07-03 LAB — POCT GLYCOSYLATED HEMOGLOBIN (HGB A1C): Hemoglobin A1C: 7.1

## 2013-07-03 LAB — GLUCOSE, POCT (MANUAL RESULT ENTRY): POC Glucose: 132 mg/dl — AB (ref 70–99)

## 2013-07-03 MED ORDER — HYDROCODONE-ACETAMINOPHEN 5-325 MG PO TABS: 1.0000 | ORAL_TABLET | Freq: Four times a day (QID) | ORAL | Status: DC | PRN

## 2013-07-03 NOTE — Patient Instructions (Addendum)
For your headache take hydrocodone 5 mg. every 4-6 hours if the headache is bad  Take a tizanidine at bedtime  CT scan can be done today at [facility] [address] [place]   Return if headache persists or at any time if worse

## 2013-07-03 NOTE — Progress Notes (Signed)
Subjective: 67 year old lady who comes in with a history of having had a headache for the last 3 days in the left side of her forehead. She knows of no trauma. The pain came on and has persisted. It runs about a level IV/X but occasionally shoots up to about 8/10 in severity. She has not had headaches like this. Years ago she used to have migraines, but this is not like her migraines. She has had no aura. No photophobia. No tinnitus. No dizziness. Sometimes he gets a little worse when she changes positions. She is under a good deal of stress. 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 She does not smoke. Her husband has had MS diagnosed about a year ago, and he is bad health is a stress to her. She is getting right to go tingling to visit her son next week.  She is diabetic and would like her A1c checked.  Objective: Pleasant alert oriented lady in no major acute distress. Her TMs are normal. Eyes PERRLA. EOMs intact. Throat clear. Neck supple without nodes thyromegaly. No carotid bruits. Chest clear. Heart regular without murmurs. Motor strength is symmetrical. Rapid alternating movements normal. Cranial nerves intact.  Assessment: New onset headache Type 2 diabetes Stress  Plan: CBC, sedimentation rate, glucose, hemoglobin A1c CT head  Results for orders placed in visit on 07/03/13  POCT CBC      Result Value Range   WBC 6.7  4.6 - 10.2 K/uL   Lymph, poc 1.6  0.6 - 3.4   POC LYMPH PERCENT 24.1  10 - 50 %L   MID (cbc) 0.5  0 - 0.9   POC MID % 6.8  0 - 12 %M   POC Granulocyte 4.6  2 - 6.9   Granulocyte percent 69.1  37 - 80 %G   RBC 4.84  4.04 - 5.48 M/uL   Hemoglobin 15.7  12.2 - 16.2 g/dL   HCT, POC 16.1 (*) 09.6 - 47.9 %   MCV 101.0 (*) 80 - 97 fL   MCH, POC 32.4 (*) 27 - 31.2 pg   MCHC 32.1  31.8 - 35.4 g/dL   RDW, POC 04.5     Platelet Count, POC 210  142 - 424 K/uL   MPV 8.3  0 - 99.8 fL  POCT GLYCOSYLATED HEMOGLOBIN (HGB A1C)      Result Value Range   Hemoglobin A1C 7.1    GLUCOSE, POCT (MANUAL RESULT ENTRY)      Result Value Range   POC Glucose 132 (*) 70 - 99 mg/dl

## 2013-08-14 ENCOUNTER — Ambulatory Visit (INDEPENDENT_AMBULATORY_CARE_PROVIDER_SITE_OTHER): Payer: Medicare Other | Admitting: Emergency Medicine

## 2013-08-14 VITALS — BP 140/80 | HR 79 | Temp 98.7°F | Resp 16 | Ht 61.75 in | Wt 232.0 lb

## 2013-08-14 DIAGNOSIS — J018 Other acute sinusitis: Secondary | ICD-10-CM

## 2013-08-14 DIAGNOSIS — J209 Acute bronchitis, unspecified: Secondary | ICD-10-CM

## 2013-08-14 DIAGNOSIS — J40 Bronchitis, not specified as acute or chronic: Secondary | ICD-10-CM

## 2013-08-14 MED ORDER — AMOXICILLIN-POT CLAVULANATE 875-125 MG PO TABS: 1.0000 | ORAL_TABLET | Freq: Two times a day (BID) | ORAL | Status: DC

## 2013-08-14 MED ORDER — ALBUTEROL SULFATE HFA 108 (90 BASE) MCG/ACT IN AERS: 2.0000 | INHALATION_SPRAY | RESPIRATORY_TRACT | Status: DC | PRN

## 2013-08-14 MED ORDER — PROMETHAZINE-CODEINE 6.25-10 MG/5ML PO SYRP: 5.0000 mL | ORAL_SOLUTION | Freq: Four times a day (QID) | ORAL | Status: DC | PRN

## 2013-08-14 NOTE — Patient Instructions (Signed)
Sinusitis Sinusitis is redness, soreness, and swelling (inflammation) of the paranasal sinuses. Paranasal sinuses are air pockets within the bones of your face (beneath the eyes, the middle of the forehead, or above the eyes). In healthy paranasal sinuses, mucus is able to drain out, and air is able to circulate through them by way of your nose. However, when your paranasal sinuses are inflamed, mucus and air can become trapped. This can allow bacteria and other germs to grow and cause infection. Sinusitis can develop quickly and last only a short time (acute) or continue over a long period (chronic). Sinusitis that lasts for more than 12 weeks is considered chronic.  CAUSES  Causes of sinusitis include:  Allergies.  Structural abnormalities, such as displacement of the cartilage that separates your nostrils (deviated septum), which can decrease the air flow through your nose and sinuses and affect sinus drainage.  Functional abnormalities, such as when the small hairs (cilia) that line your sinuses and help remove mucus do not work properly or are not present. SYMPTOMS  Symptoms of acute and chronic sinusitis are the same. The primary symptoms are pain and pressure around the affected sinuses. Other symptoms include:  Upper toothache.  Earache.  Headache.  Bad breath.  Decreased sense of smell and taste.  A cough, which worsens when you are lying flat.  Fatigue.  Fever.  Thick drainage from your nose, which often is green and may contain pus (purulent).  Swelling and warmth over the affected sinuses. DIAGNOSIS  Your caregiver will perform a physical exam. During the exam, your caregiver may:  Look in your nose for signs of abnormal growths in your nostrils (nasal polyps).  Tap over the affected sinus to check for signs of infection.  View the inside of your sinuses (endoscopy) with a special imaging device with a light attached (endoscope), which is inserted into your  sinuses. If your caregiver suspects that you have chronic sinusitis, one or more of the following tests may be recommended:  Allergy tests.  Nasal culture A sample of mucus is taken from your nose and sent to a lab and screened for bacteria.  Nasal cytology A sample of mucus is taken from your nose and examined by your caregiver to determine if your sinusitis is related to an allergy. TREATMENT  Most cases of acute sinusitis are related to a viral infection and will resolve on their own within 10 days. Sometimes medicines are prescribed to help relieve symptoms (pain medicine, decongestants, nasal steroid sprays, or saline sprays).  However, for sinusitis related to a bacterial infection, your caregiver will prescribe antibiotic medicines. These are medicines that will help kill the bacteria causing the infection.  Rarely, sinusitis is caused by a fungal infection. In theses cases, your caregiver will prescribe antifungal medicine. For some cases of chronic sinusitis, surgery is needed. Generally, these are cases in which sinusitis recurs more than 3 times per year, despite other treatments. HOME CARE INSTRUCTIONS   Drink plenty of water. Water helps thin the mucus so your sinuses can drain more easily.  Use a humidifier.  Inhale steam 3 to 4 times a day (for example, sit in the bathroom with the shower running).  Apply a warm, moist washcloth to your face 3 to 4 times a day, or as directed by your caregiver.  Use saline nasal sprays to help moisten and clean your sinuses.  Take over-the-counter or prescription medicines for pain, discomfort, or fever only as directed by your caregiver. SEEK IMMEDIATE MEDICAL   CARE IF:  You have increasing pain or severe headaches.  You have nausea, vomiting, or drowsiness.  You have swelling around your face.  You have vision problems.  You have a stiff neck.  You have difficulty breathing. MAKE SURE YOU:   Understand these  instructions.  Will watch your condition.  Will get help right away if you are not doing well or get worse. Document Released: 08/06/2005 Document Revised: 10/29/2011 Document Reviewed: 08/21/2011 [employer] Patient Information 2014 [employer].   Bronchitis Bronchitis is the body's way of reacting to injury and/or infection (inflammation) of the bronchi. Bronchi are the air tubes that extend from the windpipe into the lungs. If the inflammation becomes severe, it may cause shortness of breath. CAUSES  Inflammation may be caused by:  A virus.  Germs (bacteria).  Dust.  Allergens.  Pollutants and many other irritants. The cells lining the bronchial tree are covered with tiny hairs (cilia). These constantly beat upward, away from the lungs, toward the mouth. This keeps the lungs free of pollutants. When these cells become too irritated and are unable to do their job, mucus begins to develop. This causes the characteristic cough of bronchitis. The cough clears the lungs when the cilia are unable to do their job. Without either of these protective mechanisms, the mucus would settle in the lungs. Then you would develop pneumonia. Smoking is a common cause of bronchitis and can contribute to pneumonia. Stopping this habit is the single most important thing you can do to help yourself. TREATMENT   Your caregiver may prescribe an antibiotic if the cough is caused by bacteria. Also, medicines that open up your airways make it easier to breathe. Your caregiver may also recommend or prescribe an expectorant. It will loosen the mucus to be coughed up. Only take over-the-counter or prescription medicines for pain, discomfort, or fever as directed by your caregiver.  Removing whatever causes the problem (smoking, for example) is critical to preventing the problem from getting worse.  Cough suppressants may be prescribed for relief of cough symptoms.  Inhaled medicines may be prescribed to help  with symptoms now and to help prevent problems from returning.  For those with recurrent (chronic) bronchitis, there may be a need for steroid medicines. SEEK IMMEDIATE MEDICAL CARE IF:   During treatment, you develop more pus-like mucus (purulent sputum).  You have a fever.  You become progressively more ill.  You have increased difficulty breathing, wheezing, or shortness of breath. It is necessary to seek immediate medical care if you are elderly or sick from any other disease. MAKE SURE YOU:   Understand these instructions.  Will watch your condition.  Will get help right away if you are not doing well or get worse. Document Released: 08/06/2005 Document Revised: 04/08/2013 Document Reviewed: 03/31/2013 [employer] Patient Information 2014 [employer].  

## 2013-08-14 NOTE — Progress Notes (Signed)
[facility] [address], [place] [address] [phone]  Date:  08/14/2013   Name:  Alexis King   DOB:  02/15/1946   MRN:  [id number]  PCP:  No primary provider on file.    Chief Complaint: Cough   History of Present Illness:  Alexis King is a 67 y.o. very pleasant female patient who presents with the following:  Ill for 1 week with cough productive mucopurulent sputum.  Has wheezing and exertional shortness of breath. No nausea or vomiting.  No stool change or rash.  No nasal congestion or drainage but has post nasal drip.  Sore throat.  No fever or chills.  No improvement with over the counter medications or other home remedies. Denies other complaint or health concern today.   Patient Active Problem List   Diagnosis Date Noted  . HTN (hypertension) 10/22/2012  . Other and unspecified hyperlipidemia 10/22/2012  . Depression 10/22/2012  . Multiple thyroid nodules 10/22/2012  . BMI 40.0-44.9, adult 10/22/2012  . Unspecified hypothyroidism 10/22/2012  . DM (diabetes mellitus) 10/22/2012  . Lumbar spondylosis with myelopathy 10/22/2012    Past Medical History  Diagnosis Date  . Hypertension   . Hyperlipidemia   . Depression   . Diabetes mellitus   . IBS (irritable bowel syndrome)     Past Surgical History  Procedure Laterality Date  . Cholecystectomy    . Cesarean section    . Lacrimal duct probing      History  Substance Use Topics  . Smoking status: Never Smoker   . Smokeless tobacco: Not on file  . Alcohol Use: No    Family History  Problem Relation Age of Onset  . Heart disease Father     Allergies  Allergen Reactions  . Ace Inhibitors Cough  . Sulfa Antibiotics Rash    Medication list has been reviewed and updated.  Current Outpatient Prescriptions on File Prior to Visit  Medication Sig Dispense Refill  . aspirin 81 MG tablet Take 81 mg by mouth daily.      . Biotin 10 MG TABS Take 1 tablet by mouth daily.       Marland Kitchen buPROPion (WELLBUTRIN XL) 300 MG 24 hr tablet Take 1 tablet (300 mg total) by mouth daily.  90 tablet  3  . cetirizine (ZYRTEC) 10 MG tablet Take 10 mg by mouth at bedtime.      . cholecalciferol (VITAMIN D) 1000 UNITS tablet Take 2,000 Units by mouth daily.      . clonazePAM (KLONOPIN) 0.5 MG tablet TAKE 2 TABLETS BY MOUTH AT BEDTIME AS NEEDED  30 tablet  5  . fluticasone (FLONASE) 50 MCG/ACT nasal spray Place 2 sprays into the nose daily.  48 g  3  . HYDROcodone-acetaminophen (NORCO) 5-325 MG per tablet Take 1 tablet by mouth every 6 (six) hours as needed.  15 tablet  0  . Krill Oil 500 MG CAPS Take 500 mg by mouth 2 (two) times daily.      Marland Kitchen levothyroxine (SYNTHROID, LEVOTHROID) 150 MCG tablet Take 1 tablet (150 mcg total) by mouth daily before breakfast. PATIENT NEEDS OFFICE VISIT FOR ADDITIONAL REFILLS  30 tablet  0  . losartan-hydrochlorothiazide (HYZAAR) 100-12.5 MG per tablet Take 1 tablet by mouth daily.  90 tablet  3  . magnesium 30 MG tablet Take 30 mg by mouth daily.      . metFORMIN (GLUCOPHAGE-XR) 500 MG 24 hr tablet Take 1 tablet (500 mg total)  by mouth at bedtime.  90 tablet  3  . minoxidil (ROGAINE) 2 % external solution Apply topically 1 day or 1 dose.      . Multiple Vitamins-Minerals (MULTIVITAMIN WITH MINERALS) tablet Take 1 tablet by mouth daily.      . naproxen (NAPROSYN) 250 MG tablet Take 400 mg by mouth 2 (two) times daily.      . pravastatin (PRAVACHOL) 40 MG tablet Take 1 tablet (40 mg total) by mouth daily. PATIENT NEEDS OFFICE VISIT FOR ADDITIONAL REFILLS  30 tablet  0  . sertraline (ZOLOFT) 100 MG tablet Take 1 tablet (100 mg total) by mouth daily.  90 tablet  3  . vitamin C (ASCORBIC ACID) 500 MG tablet Take 500 mg by mouth daily.      . fish oil-omega-3 fatty acids 1000 MG capsule Take 1 g by mouth daily.       No current facility-administered medications on file prior to visit.    Review of Systems:  As per HPI, otherwise negative.    Physical  Examination: Filed Vitals:   08/14/13 1725  BP: 140/80  Pulse: 79  Temp: 98.7 F (37.1 C)  Resp: 16   Filed Vitals:   08/14/13 1725  Height: 5' 1.75" (1.568 m)  Weight: 232 lb (105.235 kg)   Body mass index is 42.8 kg/(m^2). Ideal Body Weight: Weight in (lb) to have BMI = 25: 135.3  GEN: WDWN, NAD, Non-toxic, A & O x 3 HEENT: Atraumatic, Normocephalic. Neck supple. No masses, No LAD.  Purulent post pharyngeal drainage. Ears and Nose: No external deformity.  Purulent nasal drainage CV: RRR, No M/G/R. No JVD. No thrill. No extra heart sounds. PULM: CTA B, scattered wheezes, no crackles, rhonchi. No retractions. No resp. distress. No accessory muscle use. ABD: S, NT, ND, +BS. No rebound. No HSM. EXTR: No c/c/e NEURO Normal gait.  PSYCH: Normally interactive. Conversant. Not depressed or anxious appearing.  Calm demeanor.    Assessment and Plan: Sinusitis Bronchitis Bronchospasm  Signed,  Phillips Odor, MD

## 2013-08-21 ENCOUNTER — Ambulatory Visit (INDEPENDENT_AMBULATORY_CARE_PROVIDER_SITE_OTHER): Payer: Medicare Other | Admitting: Emergency Medicine

## 2013-08-21 VITALS — BP 140/100 | HR 70 | Temp 99.0°F | Resp 16 | Wt 230.0 lb

## 2013-08-21 DIAGNOSIS — J209 Acute bronchitis, unspecified: Secondary | ICD-10-CM

## 2013-08-21 MED ORDER — IPRATROPIUM BROMIDE 0.02 % IN SOLN: 0.5000 mg | Freq: Once | RESPIRATORY_TRACT | Status: DC

## 2013-08-21 MED ORDER — ALBUTEROL SULFATE (2.5 MG/3ML) 0.083% IN NEBU: 5.0000 mg | INHALATION_SOLUTION | Freq: Once | RESPIRATORY_TRACT | Status: DC

## 2013-08-21 MED ORDER — LEVOFLOXACIN 500 MG PO TABS: 500.0000 mg | ORAL_TABLET | Freq: Every day | ORAL | Status: DC

## 2013-08-21 NOTE — Progress Notes (Signed)
[facility] [address] [place] [address] [phone]  Date:  08/21/2013   Name:  Alexis King   DOB:  1946-05-04   MRN:  [id number]  PCP:  No primary provider on file.    Chief Complaint: Cough   History of Present Illness:  Alexis King is a 68 y.o. very pleasant female patient who presents with the following:  Ill since before Christmas with cough and wheezing.  Did not improve on augmentin.  Did not fill the MDI.  No fever or chills.  Still has cough productive of purulent sputum. Cough worse at night.  Can't sleep for the noise she makes breathing  Some wheezing but has exertional dyspnea.  Worse than normal.  No nausea or vomiting.  No improvement with over the counter medications or other home remedies. Denies other complaint or health concern today.   Patient Active Problem List   Diagnosis Date Noted  . HTN (hypertension) 10/22/2012  . Other and unspecified hyperlipidemia 10/22/2012  . Depression 10/22/2012  . Multiple thyroid nodules 10/22/2012  . BMI 40.0-44.9, adult 10/22/2012  . Unspecified hypothyroidism 10/22/2012  . DM (diabetes mellitus) 10/22/2012  . Lumbar spondylosis with myelopathy 10/22/2012    Past Medical History  Diagnosis Date  . Hypertension   . Hyperlipidemia   . Depression   . Diabetes mellitus   . IBS (irritable bowel syndrome)     Past Surgical History  Procedure Laterality Date  . Cholecystectomy    . Cesarean section    . Lacrimal duct probing      History  Substance Use Topics  . Smoking status: Never Smoker   . Smokeless tobacco: Not on file  . Alcohol Use: No    Family History  Problem Relation Age of Onset  . Heart disease Father     Allergies  Allergen Reactions  . Ace Inhibitors Cough  . Sulfa Antibiotics Rash    Medication list has been reviewed and updated.  Current Outpatient Prescriptions on File Prior to Visit  Medication Sig Dispense Refill  . albuterol (PROVENTIL  HFA;VENTOLIN HFA) 108 (90 BASE) MCG/ACT inhaler Inhale 2 puffs into the lungs every 4 (four) hours as needed for wheezing or shortness of breath (cough, shortness of breath or wheezing.).  1 Inhaler  1  . amoxicillin-clavulanate (AUGMENTIN) 875-125 MG per tablet Take 1 tablet by mouth 2 (two) times daily.  20 tablet  0  . aspirin 81 MG tablet Take 81 mg by mouth daily.      . Biotin 10 MG TABS Take 1 tablet by mouth daily.      Marland Kitchen buPROPion (WELLBUTRIN XL) 300 MG 24 hr tablet Take 1 tablet (300 mg total) by mouth daily.  90 tablet  3  . cholecalciferol (VITAMIN D) 1000 UNITS tablet Take 2,000 Units by mouth daily.      . clonazePAM (KLONOPIN) 0.5 MG tablet TAKE 2 TABLETS BY MOUTH AT BEDTIME AS NEEDED  30 tablet  5  . fluticasone (FLONASE) 50 MCG/ACT nasal spray Place 2 sprays into the nose daily.  48 g  3  . HYDROcodone-acetaminophen (NORCO) 5-325 MG per tablet Take 1 tablet by mouth every 6 (six) hours as needed.  15 tablet  0  . Krill Oil 500 MG CAPS Take 500 mg by mouth 2 (two) times daily.      Marland Kitchen levothyroxine (SYNTHROID, LEVOTHROID) 150 MCG tablet Take 1 tablet (150 mcg total) by mouth daily before breakfast. PATIENT NEEDS  OFFICE VISIT FOR ADDITIONAL REFILLS  30 tablet  0  . losartan-hydrochlorothiazide (HYZAAR) 100-12.5 MG per tablet Take 1 tablet by mouth daily.  90 tablet  3  . magnesium 30 MG tablet Take 30 mg by mouth daily.      . metFORMIN (GLUCOPHAGE-XR) 500 MG 24 hr tablet Take 1 tablet (500 mg total) by mouth at bedtime.  90 tablet  3  . minoxidil (ROGAINE) 2 % external solution Apply topically 1 day or 1 dose.      . Multiple Vitamins-Minerals (MULTIVITAMIN WITH MINERALS) tablet Take 1 tablet by mouth daily.      . naproxen (NAPROSYN) 250 MG tablet Take 400 mg by mouth 2 (two) times daily.      . pravastatin (PRAVACHOL) 40 MG tablet Take 1 tablet (40 mg total) by mouth daily. PATIENT NEEDS OFFICE VISIT FOR ADDITIONAL REFILLS  30 tablet  0  . promethazine-codeine (PHENERGAN WITH  CODEINE) 6.25-10 MG/5ML syrup Take 5-10 mLs by mouth every 6 (six) hours as needed.  120 mL  0  . sertraline (ZOLOFT) 100 MG tablet Take 1 tablet (100 mg total) by mouth daily.  90 tablet  3  . vitamin C (ASCORBIC ACID) 500 MG tablet Take 500 mg by mouth daily.      . cetirizine (ZYRTEC) 10 MG tablet Take 10 mg by mouth at bedtime.      . fish oil-omega-3 fatty acids 1000 MG capsule Take 1 g by mouth daily.       No current facility-administered medications on file prior to visit.    Review of Systems:  As per HPI, otherwise negative.    Physical Examination: Filed Vitals:   08/21/13 1450  BP: 140/100  Pulse: 70  Temp: 99 F (37.2 C)  Resp: 16   Filed Vitals:   08/21/13 1450  Weight: 230 lb (104.327 kg)   Body mass index is 42.43 kg/(m^2). Ideal Body Weight:    GEN: WDWN, NAD, Non-toxic, A & O x 3 HEENT: Atraumatic, Normocephalic. Neck supple. No masses, No LAD. Ears and Nose: No external deformity. CV: RRR, No M/G/R. No JVD. No thrill. No extra heart sounds. PULM: CTA B, moderate wheezes, no crackles, rhonchi. No retractions. No resp. distress. No accessory muscle use. ABD: S, NT, ND, +BS. No rebound. No HSM. EXTR: No c/c/e NEURO Normal gait.  PSYCH: Normally interactive. Conversant. Not depressed or anxious appearing.  Calm demeanor.    Assessment and Plan: levaquin Albuterol  Signed,  Ellison Carwin, MD

## 2013-08-21 NOTE — Patient Instructions (Signed)
Metered Dose Inhaler (No Spacer Used) Inhaled medicines are the basis of asthma treatment and other breathing problems. Inhaled medicine can only be effective if used properly. Good technique assures that the medicine reaches the lungs. Metered dose inhalers (MDIs) are used to deliver a variety of inhaled medicines. These include quick relief or rescue medicines (such as bronchodilators) and controller medicines (such as corticosteroids). The medicine is delivered by pushing down on a metal canister to release a set amount of spray. If you are using different kinds of inhalers, use your quick relief medicine to open the airways 10 15 minutes before using a steroid if instructed to do so by your health care provider. If you are unsure which inhalers to use and the order of using them, ask your health care provider, nurse, or respiratory therapist. HOW TO USE THE INHALER 1. Remove cap from inhaler. 2. If you are using the inhaler for the first time, you will need to prime it. Shake the inhaler for 5 seconds and release four puffs into the air, away from your face. Ask your health care provider or pharmacist if you have questions about priming your inhaler. 3. Shake inhaler for 5 seconds before each breath in (inhalation). 4. Position the inhaler so that the top of the canister faces up. 5. Put your index finger on the top of the medicine canister. Your thumb supports the bottom of the inhaler. 6. Open your mouth. 7. Either place the inhaler between your teeth and place your lips tightly around the mouthpiece, or hold the inhaler 1 2 inches away from your open mouth. If you are unsure of which technique to use, ask your health care provider. 8. Breathe out (exhale) normally and as completely as possible. 9. Press the canister down with the index finger to release the medicine. 10. At the same time as the canister is pressed, inhale deeply and slowly until the lungs are completely filled. This should take  4 6 seconds. Keep your tongue down. 11. Hold the medicine in your lungs for up to 5 10 seconds (10 seconds is best). This helps the medicine get into the small airways of your lungs. 12. Breathe out slowly, through pursed lips. Whistling is an example of pursed lips. 13. Wait at least 1 minute between puffs. Continue with the above steps until you have taken the number of puffs your health care provider has ordered. Do not use the inhaler more than your health care provider directs you to. 14. Replace cap on inhaler. 15. Follow the directions from your health care provider or the inhaler insert for cleaning the inhaler. If you are using a steroid inhaler, rinse your mouth with water after your last puff, gargle, and spit out the water. Do not swallow the water. AVOID:  Inhaling before or after starting the spray of medicine. It takes practice to coordinate your breathing with triggering the spray.  Inhaling through the nose (rather than the mouth) when triggering the spray. HOW TO DETERMINE IF YOUR INHALER IS FULL OR NEARLY EMPTY You cannot know when an inhaler is empty by shaking it. A few inhalers are now being made with dose counters. Ask your health care provider for a prescription that has a dose counter if you feel you need that extra help. If your inhaler does not have a counter, ask your health care provider to help you determine the date you need to refill your inhaler. Write the refill date on a calendar or your inhaler canister.  Refill your inhaler 7 10 days before it runs out. Be sure to keep an adequate supply of medicine. This includes making sure it is not expired, and you have a spare inhaler.  SEEK MEDICAL CARE IF:   Symptoms are only partially relieved with your inhaler.  You are having trouble using your inhaler.  You experience some increase in phlegm. SEEK IMMEDIATE MEDICAL CARE IF:   You feel little or no relief with your inhalers. You are still wheezing and are feeling  shortness of breath or tightness in your chest or both.  You have dizziness, headaches, or fast heart rate.  You have chills, fever, or night sweats.  There is a noticeable increase in phlegm production, or there is blood in the phlegm. Document Released: 06/03/2007 Document Revised: 04/08/2013 Document Reviewed: 01/22/2013 [facility] Patient Information 2014 [place], [place]. Acute Bronchitis Bronchitis is inflammation of the airways that extend from the windpipe into the lungs (bronchi). The inflammation often causes mucus to develop. This leads to a cough, which is the most common symptom of bronchitis.  In acute bronchitis, the condition usually develops suddenly and goes away over time, usually in a couple weeks. Smoking, allergies, and asthma can make bronchitis worse. Repeated episodes of bronchitis may cause further lung problems.  CAUSES Acute bronchitis is most often caused by the same virus that causes a cold. The virus can spread from person to person (contagious).  SIGNS AND SYMPTOMS   Cough.   Fever.   Coughing up mucus.   Body aches.   Chest congestion.   Chills.   Shortness of breath.   Sore throat.  DIAGNOSIS  Acute bronchitis is usually diagnosed through a physical exam. Tests, such as chest X-rays, are sometimes done to rule out other conditions.  TREATMENT  Acute bronchitis usually goes away in a couple weeks. Often times, no medical treatment is necessary. Medicines are sometimes given for relief of fever or cough. Antibiotics are usually not needed but may be prescribed in certain situations. In some cases, an inhaler may be recommended to help reduce shortness of breath and control the cough. A cool mist vaporizer may also be used to help thin bronchial secretions and make it easier to clear the chest.  HOME CARE INSTRUCTIONS  Get plenty of rest.   Drink enough fluids to keep your urine clear or pale yellow (unless you have a medical condition that  requires fluid restriction). Increasing fluids may help thin your secretions and will prevent dehydration.   Only take over-the-counter or prescription medicines as directed by your health care provider.   Avoid smoking and secondhand smoke. Exposure to cigarette smoke or irritating chemicals will make bronchitis worse. If you are a smoker, consider using nicotine gum or skin patches to help control withdrawal symptoms. Quitting smoking will help your lungs heal faster.   Reduce the chances of another bout of acute bronchitis by washing your hands frequently, avoiding people with cold symptoms, and trying not to touch your hands to your mouth, nose, or eyes.   Follow up with your health care provider as directed.  SEEK MEDICAL CARE IF: Your symptoms do not improve after 1 week of treatment.  SEEK IMMEDIATE MEDICAL CARE IF:  You develop an increased fever or chills.   You have chest pain.   You have severe shortness of breath.  You have bloody sputum.   You develop dehydration.  You develop fainting.  You develop repeated vomiting.  You develop a severe headache. MAKE SURE  YOU:   Understand these instructions.  Will watch your condition.  Will get help right away if you are not doing well or get worse. Document Released: 09/13/2004 Document Revised: 04/08/2013 Document Reviewed: 01/27/2013 [facility] Patient Information 2014 [place].

## 2013-08-23 ENCOUNTER — Encounter: Payer: Self-pay | Admitting: Emergency Medicine

## 2013-08-25 ENCOUNTER — Other Ambulatory Visit: Payer: Self-pay | Admitting: Emergency Medicine

## 2013-08-25 ENCOUNTER — Encounter: Payer: Self-pay | Admitting: Emergency Medicine

## 2013-08-25 MED ORDER — FLUCONAZOLE 150 MG PO TABS: 150.0000 mg | ORAL_TABLET | Freq: Once | ORAL | Status: DC

## 2013-08-25 NOTE — Telephone Encounter (Signed)
Pt email reports two rounds of antibiotics resulting in a  Vaginal yeast infection. She is requesting diflucan to be called in to [facility]. I have pended.

## 2013-08-25 NOTE — Addendum Note (Signed)
Addended by: Kem Boroughs D on: 08/25/2013 10:47 AM   Modules accepted: Orders

## 2013-08-26 ENCOUNTER — Encounter: Payer: Self-pay | Admitting: Emergency Medicine

## 2013-08-27 MED ORDER — CEFPROZIL 250 MG PO TABS: 250.0000 mg | ORAL_TABLET | Freq: Two times a day (BID) | ORAL | Status: DC

## 2013-08-28 ENCOUNTER — Ambulatory Visit (INDEPENDENT_AMBULATORY_CARE_PROVIDER_SITE_OTHER): Payer: Medicare Other | Admitting: Emergency Medicine

## 2013-08-28 ENCOUNTER — Ambulatory Visit: Payer: Medicare Other

## 2013-08-28 VITALS — BP 146/98 | HR 87 | Temp 98.4°F | Resp 18 | Ht 61.75 in | Wt 230.0 lb

## 2013-08-28 DIAGNOSIS — M79662 Pain in left lower leg: Secondary | ICD-10-CM

## 2013-08-28 DIAGNOSIS — M79641 Pain in right hand: Secondary | ICD-10-CM

## 2013-08-28 DIAGNOSIS — R05 Cough: Secondary | ICD-10-CM

## 2013-08-28 DIAGNOSIS — M79609 Pain in unspecified limb: Secondary | ICD-10-CM

## 2013-08-28 DIAGNOSIS — R059 Cough, unspecified: Secondary | ICD-10-CM

## 2013-08-28 NOTE — Progress Notes (Addendum)
Subjective:    Patient ID: Alexis King, female    DOB: Oct 30, 1945, 68 y.o.   MRN: [id number]  HPI This chart was scribed for Alexis King Daub-MD, by Lovena Le Day, Scribe. This patient was seen in room 14 and the patient's care was started at 6:54 PM.  HPI Comments: Alexis King is a 68 y.o. female who presents to the [facility] complaining of constant cough productive of sputum and chest congestion, onset week and a half ago. She was seen here 12/26 and 1/02 by Dr. Ouida Sills and on 12/26 she was started augmentin but reports that she did not improve so on 1/02 she was started on levaquin. She reports has not improved since she began levaquin. She also reports left achilles tendon pain onset several days ago which she attributes to recently starting levaquin, she denies any injury that may have caused this pain.   She also reports right hand pain which occurred while she was shaking off her hands after she had just washed them a few days ago and has had constant pain in her hand since this time.   Patient Active Problem List   Diagnosis Date Noted  . HTN (hypertension) 10/22/2012  . Other and unspecified hyperlipidemia 10/22/2012  . Depression 10/22/2012  . Multiple thyroid nodules 10/22/2012  . BMI 40.0-44.9, adult 10/22/2012  . Unspecified hypothyroidism 10/22/2012  . DM (diabetes mellitus) 10/22/2012  . Lumbar spondylosis with myelopathy 10/22/2012   Past Surgical History  Procedure Laterality Date  . Cholecystectomy    . Cesarean section    . Lacrimal duct probing     Family History  Problem Relation Age of Onset  . Heart disease Father    History   Social History  . Marital Status: Married    Spouse Name: N/A    Number of Children: N/A  . Years of Education: N/A   Occupational History  . Not on file.   Social History Main Topics  . Smoking status: Never Smoker   . Smokeless tobacco: Not on file  . Alcohol Use: No  . Drug Use: No  . Sexual  Activity: Not on file   Other Topics Concern  . Not on file   Social History Narrative  . No narrative on file   Allergies  Allergen Reactions  . Ace Inhibitors Cough  . Levaquin [Levofloxacin In D5w] Other (See Comments)    Achillis tendon pain  . Meloxicam Nausea Only    Stomach Cramps  . Sulfa Antibiotics Rash   Results for orders placed in visit on 07/03/13  POCT CBC      Result Value Range   WBC 6.7  4.6 - 10.2 K/uL   Lymph, poc 1.6  0.6 - 3.4   POC LYMPH PERCENT 24.1  10 - 50 %L   MID (cbc) 0.5  0 - 0.9   POC MID % 6.8  0 - 12 %M   POC Granulocyte 4.6  2 - 6.9   Granulocyte percent 69.1  37 - 80 %G   RBC 4.84  4.04 - 5.48 M/uL   Hemoglobin 15.7  12.2 - 16.2 g/dL   HCT, POC 48.9 (*) 37.7 - 47.9 %   MCV 101.0 (*) 80 - 97 fL   MCH, POC 32.4 (*) 27 - 31.2 pg   MCHC 32.1  31.8 - 35.4 g/dL   RDW, POC 13.1     Platelet Count, POC 210  142 - 424 K/uL   MPV  8.3  0 - 99.8 fL  POCT SEDIMENTATION RATE      Result Value Range   POCT SED RATE 34 (*) 0 - 22 mm/hr  POCT GLYCOSYLATED HEMOGLOBIN (HGB A1C)      Result Value Range   Hemoglobin A1C 7.1    GLUCOSE, POCT (MANUAL RESULT ENTRY)      Result Value Range   POC Glucose 132 (*) 70 - 99 mg/dl   Review of Systems  Constitutional: Negative for fever and chills.  Respiratory: Positive for cough. Negative for shortness of breath.   Musculoskeletal:       Left achilles and right hand pain       Objective:   Physical Exam Nursing note and vitals reviewed. Constitutional: Patient is oriented to person, place, and time. Patient appears well-developed and well-nourished. No distress.  HENT:  Head: Normocephalic and atraumatic.  Neck: Neck supple. No tracheal deviation present.  Cardiovascular: Normal rate, regular rhythm and normal heart sounds.   No murmur heard. Pulmonary/Chest: Effort normal and breath sounds normal. No respiratory distress. Patient has no wheezes. Patient has no rales.  Musculoskeletal: Normal range  of motion.  Neurological: Patient is alert and oriented to person, place, and time.  Skin: Skin is warm and dry.  Psychiatric: Patient has a normal mood and affect. Patient's behavior is normal.  EXT there is tenderness over the ulnar styloid on the right wrist. There is puffiness in this area the there is tenderness over the lower left calf approximately 3 inches above the Achilles Triage Vitals: BP 146/98  Pulse 87  Temp(Src) 98.4 F (36.9 C) (Oral)  Resp 18  Ht 5' 1.75" (1.568 m)  Wt 230 lb (104.327 kg)  BMI 42.43 kg/m2  SpO2 94%  DIAGNOSTIC STUDIES: Oxygen Saturation is 94% on room air, adequate by my interpretation.   [facility] reading (PRIMARY) by  Dr. Everlene Farrier chest x-ray shows a streaky lingular infiltrate and films of the hand  did not show any specific abnormalities.   COORDINATION OF CARE: At 650 PM Discussed treatment plan with patient which includes CXR and right wrist/hand X-ray. Patient agrees.      Assessment & Plan:  X-rays will be done. We'll do a Doppler study tomorrow on both calves because they are somewhat tender and she has a pulse ox of 94 I do not see fractures we'll treat the right wrist injury with a splint and await the radiology report. She has had a total of 2 weeks of antibiotics.. I personally performed the services described in this documentation, which was scribed in my presence. The recorded information has been reviewed and is accurate.

## 2013-08-29 NOTE — Telephone Encounter (Signed)
Called pt to see when she was able to get her doppler done. She was unavailable and would call me back.

## 2013-08-29 NOTE — Telephone Encounter (Signed)
Spoke with pt she is ready to get doppler, I had been calling her on her cell phone, but got her on her home phone.  I called Candace at [facility] to see if pt could come over Im awaiting her call. Candace called back and stated she could scan pt at 8:00 tomorrow morning. I called pt to let her know. She agreed.

## 2013-08-30 ENCOUNTER — Ambulatory Visit (HOSPITAL_COMMUNITY)
Admission: RE | Admit: 2013-08-30 | Discharge: 2013-08-30 | Disposition: A | Discharge status: Home / Self Care | Payer: Medicare Other | Source: Ambulatory Visit | Attending: Emergency Medicine | Admitting: Emergency Medicine

## 2013-08-30 DIAGNOSIS — M79662 Pain in left lower leg: Secondary | ICD-10-CM

## 2013-08-30 DIAGNOSIS — M79609 Pain in unspecified limb: Secondary | ICD-10-CM

## 2013-08-30 DIAGNOSIS — R05 Cough: Secondary | ICD-10-CM

## 2013-08-30 DIAGNOSIS — R059 Cough, unspecified: Secondary | ICD-10-CM

## 2013-08-30 DIAGNOSIS — M79641 Pain in right hand: Secondary | ICD-10-CM

## 2013-08-30 NOTE — Addendum Note (Signed)
Addended by: Constance Goltz on: 08/30/2013 08:17 AM   Modules accepted: Orders

## 2013-08-30 NOTE — Progress Notes (Signed)
*  PRELIMINARY RESULTS* Vascular Ultrasound Lower extremity veous duplex has been completed.  Preliminary findings: no evidence of DVT.    Landry Mellow, RDMS, RVT  08/30/2013, 8:31 AM

## 2013-08-31 ENCOUNTER — Telehealth: Payer: Self-pay

## 2013-08-31 NOTE — Telephone Encounter (Signed)
Dr. Everlene Farrier,  I believe this pt was called about her dopler results. I do not see any messages where we have called her.

## 2013-08-31 NOTE — Telephone Encounter (Signed)
Patient notified and voiced understanding.

## 2013-08-31 NOTE — Telephone Encounter (Signed)
Call no evidence of clot on her Doppler study

## 2013-08-31 NOTE — Telephone Encounter (Signed)
Patient states that she had a missed call from our office. Is unsure what is may be about; states that she was seen several times over last few months.  [phone]

## 2013-09-14 ENCOUNTER — Ambulatory Visit: Payer: Medicare Other

## 2013-09-14 ENCOUNTER — Ambulatory Visit (INDEPENDENT_AMBULATORY_CARE_PROVIDER_SITE_OTHER): Payer: Medicare Other | Admitting: Emergency Medicine

## 2013-09-14 VITALS — BP 130/88 | HR 85 | Temp 98.0°F | Resp 16 | Ht 62.5 in | Wt 230.0 lb

## 2013-09-14 DIAGNOSIS — R05 Cough: Secondary | ICD-10-CM

## 2013-09-14 DIAGNOSIS — R918 Other nonspecific abnormal finding of lung field: Secondary | ICD-10-CM

## 2013-09-14 DIAGNOSIS — R059 Cough, unspecified: Secondary | ICD-10-CM

## 2013-09-14 MED ORDER — HYDROCODONE-HOMATROPINE 5-1.5 MG/5ML PO SYRP: 5.0000 mL | ORAL_SOLUTION | Freq: Three times a day (TID) | ORAL | Status: DC | PRN

## 2013-09-14 MED ORDER — OMEPRAZOLE 40 MG PO CPDR: 40.0000 mg | DELAYED_RELEASE_CAPSULE | Freq: Every day | ORAL | Status: DC

## 2013-09-14 NOTE — Progress Notes (Addendum)
Subjective:    Patient ID: Alexis King, female    DOB: [birth year], 68 y.o.   MRN: [id number] This chart was scribed for Darlyne Russian, MD by Anastasia Pall, ED Scribe. This patient was seen in room 12 and the patient's care was started at 2:51 PM.  Chief Complaint  Patient presents with  . Follow-up    chest x ray    HPI Alexis King is a 68 y.o. female Pt was seen here 09/01/2103 for cough, chest congestion. Doppler, CXR was done.  She presents today with same symptoms, reporting chest congestion and cough, productive of sputum every 2 hours. She reports trouble speaking due to persistent coughing trying to bring up congestion. She reports drinking fluids normally. She reports finishing her most recent antibiotic, without relief. She denies any other symptoms.   PCP - No primary provider on file.  Patient Active Problem List   Diagnosis Date Noted  . HTN (hypertension) 10/22/2012  . Other and unspecified hyperlipidemia 10/22/2012  . Depression 10/22/2012  . Multiple thyroid nodules 10/22/2012  . BMI 40.0-44.9, adult 10/22/2012  . Unspecified hypothyroidism 10/22/2012  . DM (diabetes mellitus) 10/22/2012  . Lumbar spondylosis with myelopathy 10/22/2012   Past Surgical History  Procedure Laterality Date  . Cholecystectomy    . Cesarean section    . Lacrimal duct probing     Prior to Admission medications   Medication Sig Start Date End Date Taking? Authorizing Provider  albuterol (PROVENTIL HFA;VENTOLIN HFA) 108 (90 BASE) MCG/ACT inhaler Inhale 2 puffs into the lungs every 4 (four) hours as needed for wheezing or shortness of breath (cough, shortness of breath or wheezing.). 08/14/13  Yes Ellison Carwin, MD  aspirin 81 MG tablet Take 81 mg by mouth daily.   Yes Historical Provider, MD  Biotin 10 MG TABS Take 1 tablet by mouth daily.   Yes Historical Provider, MD  buPROPion (WELLBUTRIN XL) 300 MG 24 hr tablet Take 1 tablet (300 mg total) by mouth daily. 12/18/12  Yes  Leandrew Koyanagi, MD  cefPROZIL (CEFZIL) 250 MG tablet Take 1 tablet (250 mg total) by mouth 2 (two) times daily. 08/27/13  Yes Ellison Carwin, MD  cholecalciferol (VITAMIN D) 1000 UNITS tablet Take 2,000 Units by mouth daily.   Yes Historical Provider, MD  clonazePAM (KLONOPIN) 0.5 MG tablet TAKE 2 TABLETS BY MOUTH AT BEDTIME AS NEEDED 04/21/13  Yes Leandrew Koyanagi, MD  Krill Oil 500 MG CAPS Take 500 mg by mouth 2 (two) times daily.   Yes Historical Provider, MD  levothyroxine (SYNTHROID, LEVOTHROID) 150 MCG tablet Take 1 tablet (150 mcg total) by mouth daily before breakfast. PATIENT NEEDS OFFICE VISIT FOR ADDITIONAL REFILLS 06/25/13  Yes Leandrew Koyanagi, MD  losartan-hydrochlorothiazide (HYZAAR) 100-12.5 MG per tablet Take 1 tablet by mouth daily. 12/18/12  Yes Leandrew Koyanagi, MD  magnesium 30 MG tablet Take 30 mg by mouth daily.   Yes Historical Provider, MD  metFORMIN (GLUCOPHAGE-XR) 500 MG 24 hr tablet Take 1 tablet (500 mg total) by mouth at bedtime. 12/18/12  Yes Leandrew Koyanagi, MD  minoxidil (ROGAINE) 2 % external solution Apply topically 1 day or 1 dose.   Yes Historical Provider, MD  Multiple Vitamins-Minerals (MULTIVITAMIN WITH MINERALS) tablet Take 1 tablet by mouth daily.   Yes Historical Provider, MD  naproxen (NAPROSYN) 250 MG tablet Take 400 mg by mouth 2 (two) times daily.   Yes Historical Provider, MD  pravastatin (PRAVACHOL) 40 MG tablet Take 1  tablet (40 mg total) by mouth daily. PATIENT NEEDS OFFICE VISIT FOR ADDITIONAL REFILLS 06/25/13  Yes Leandrew Koyanagi, MD  promethazine-codeine [facility] WITH CODEINE) 6.25-10 MG/5ML syrup Take 5-10 mLs by mouth every 6 (six) hours as needed. 08/14/13  Yes Ellison Carwin, MD  sertraline (ZOLOFT) 100 MG tablet Take 1 tablet (100 mg total) by mouth daily. 12/02/12  Yes Leandrew Koyanagi, MD  vitamin C (ASCORBIC ACID) 500 MG tablet Take 500 mg by mouth daily.   Yes Historical Provider, MD  fluticasone (FLONASE) 50 MCG/ACT nasal  spray Place 2 sprays into the nose daily. 12/18/12   Leandrew Koyanagi, MD    Review of Systems  Constitutional: Negative for fever.  HENT: Positive for congestion (chest).   Respiratory: Positive for cough.        Objective:   Physical Exam  Nursing note and vitals reviewed. Constitutional: She is oriented to person, place, and time. She appears well-developed and well-nourished. No distress.  HENT:  Head: Normocephalic and atraumatic.  Right Ear: Tympanic membrane, external ear and ear canal normal.  Left Ear: Tympanic membrane, external ear and ear canal normal.  Nose: Nose normal.  Mouth/Throat: Uvula is midline, oropharynx is clear and moist and mucous membranes are normal. No oropharyngeal exudate.  Eyes: EOM are normal.  Neck: Neck supple.  Cardiovascular: Normal rate, regular rhythm and normal heart sounds.   No murmur heard. Pulmonary/Chest: Effort normal and breath sounds normal. No respiratory distress. She has no wheezes. She has no rales.  Musculoskeletal: Normal range of motion.  Neurological: She is alert and oriented to person, place, and time.  Skin: Skin is warm and dry.  Psychiatric: She has a normal mood and affect. Her behavior is normal.    BP 130/88  Pulse 85  Temp(Src) 98 F (36.7 C) (Oral)  Resp 16  Ht 5' 2.5" (1.588 m)  Wt 230 lb (104.327 kg)  BMI 41.37 kg/m2  SpO2 91%  [facility] reading (PRIMARY) by Dr. Everlene Farrier is an infiltrate in the lingula obscuring the left heart border.. sinus films are clear     Assessment & Plan:  No diagnosis found. patient has persistent cough persistent lingular infiltrate. I refilled her cough medication she will take Prilosec one a day referral made to pulmonary for their evaluation.  No orders of the defined types were placed in this encounter.       I personally performed the services described in this documentation, which was scribed in my presence. The recorded information has been reviewed and is accurate.

## 2013-09-16 ENCOUNTER — Other Ambulatory Visit: Payer: Self-pay | Admitting: Internal Medicine

## 2013-09-16 ENCOUNTER — Encounter: Payer: Self-pay | Admitting: Pulmonary Disease

## 2013-09-16 ENCOUNTER — Ambulatory Visit (INDEPENDENT_AMBULATORY_CARE_PROVIDER_SITE_OTHER): Payer: Medicare Other | Admitting: Pulmonary Disease

## 2013-09-16 VITALS — BP 140/98 | HR 72 | Temp 97.8°F | Ht 62.5 in | Wt 234.0 lb

## 2013-09-16 DIAGNOSIS — R053 Chronic cough: Secondary | ICD-10-CM | POA: Insufficient documentation

## 2013-09-16 DIAGNOSIS — R05 Cough: Secondary | ICD-10-CM

## 2013-09-16 DIAGNOSIS — R9389 Abnormal findings on diagnostic imaging of other specified body structures: Secondary | ICD-10-CM

## 2013-09-16 DIAGNOSIS — R059 Cough, unspecified: Secondary | ICD-10-CM

## 2013-09-16 DIAGNOSIS — R918 Other nonspecific abnormal finding of lung field: Secondary | ICD-10-CM

## 2013-09-16 NOTE — Assessment & Plan Note (Signed)
The patient has scarring in her lingula on chest x-ray, but this is also a finding from 2011 on CT chest. It looks a little more prominent on the film currently, and therefore I would recommend a followup CT chest to see if this is an evolving process. One thought is whether this could represent bronchiectasis. However, the patient understands this may have absolutely nothing to do with her current symptomatology.

## 2013-09-16 NOTE — Patient Instructions (Signed)
Will schedule for scan of your chest to evaluate your abnormal chest xray.  This may just be a chronic finding that has no bearing on your current symptoms.  Stop nebulizer and inhaler treatments. Increase omeprazole to 40mg  one in am AND pm Take chlorpheniramine 4mg  otc, 2 at bedtime each night for the next 2 weeks.  Avoid throat clearing and prolonged conversation, use hard candy to bathe back of throat and help throat clearing. Will call with results of scan, and also check on your progress.  If continues to be a problem, would strongly suggest a course of prednisone to treat post-infectious bronchiolitis.

## 2013-09-16 NOTE — Assessment & Plan Note (Addendum)
The patient has a chronic cough that sounds more upper airway in origin than lower at this point. She certainly could have postinfectious bronchiolitis, and would benefit the most from a course of prednisone.  However, she would like to avoid this in light of her known diabetes. She is willing to try this if nothing else is helping her. At least her spiro today shows no obstruction, and therefore she needs to stop her bronchodilators.  For now, I would like to treat her for the upper airway causes of cough, including postnasal drip and reflux. I have also discussed with her cyclical coughing, and how we used behavioral therapies to try and help this.

## 2013-09-16 NOTE — Progress Notes (Signed)
   Subjective:    Patient ID: Alexis King, female    DOB: 07-12-1946, 68 y.o.   MRN: [id number]  HPI The patient is a 68 year old female who I've been asked to see for persistent cough. She was in her usual state of health before Christmas, but he didn't have a dry hacking cough. This eventually became productive, and productive of purulent mucus. She was treated with a course of amoxicillin and a short course of Levaquin to which she had side effects. She was then changed to what sounds like a cephalosporin antibiotic. Despite this, she continued to have her cough with some mucus production, but ultimately it became more dry and hacking in nature. She will occasionally produce clear to scant yellow. She also describes cough paroxysms at times, where she has a tickle in her throat and an inability to clear secretions. The patient admits that she does have throat clearing, and also has a history of chronic postnasal drip. She apparently had a sinus x-ray with her primary physician, but these were plain films and not a CT. She also has occasional breakthrough reflux symptoms, and has been started on once a day proton pump inhibitor. The patient has no history of asthma, and has never smoked. She has not been treated with prednisone during this course, nor has she had spirometry.  She has had a chest x-ray the beginning of the year that was clear except for some scarring in the lingula this has been repeated, and is without change. It should be noted, the patient had a CT of her chest in 2011 which also showed scarring in the lingula as well.   Review of Systems  Constitutional: Negative for fever and unexpected weight change.  HENT: Negative for congestion, dental problem, ear pain, nosebleeds, postnasal drip, rhinorrhea, sinus pressure, sneezing, sore throat and trouble swallowing.   Eyes: Negative for redness and itching.  Respiratory: Positive for cough and wheezing. Negative for chest tightness  and shortness of breath.   Cardiovascular: Negative for palpitations and leg swelling.  Gastrointestinal: Negative for nausea and vomiting.  Genitourinary: Negative for dysuria.  Musculoskeletal: Negative for joint swelling.  Skin: Negative for rash.  Neurological: Negative for headaches.  Hematological: Does not bruise/bleed easily.  Psychiatric/Behavioral: Negative for dysphoric mood. The patient is not nervous/anxious.        Objective:   Physical Exam Constitutional:  Obese female, no acute distress  HENT:  Nares patent without discharge  Oropharynx without exudate, palate and uvula are normal  Eyes:  Perrla, eomi, no scleral icterus  Neck:  No JVD, no TMG.  Healed scar thoracic inlet.  Cardiovascular:  Normal rate, regular rhythm, no rubs or gallops.  1/6 sem        Intact distal pulses  Pulmonary :  Normal breath sounds, no stridor or respiratory distress   No rales, rhonchi, or wheezing  Abdominal:  Soft, nondistended, bowel sounds present.  No tenderness noted.   Musculoskeletal:  2+ lower extremity edema noted.  Lymph Nodes:  No cervical lymphadenopathy noted  Skin:  No cyanosis noted  Neurologic:  Alert, appropriate, moves all 4 extremities without obvious deficit.         Assessment & Plan:

## 2013-09-17 NOTE — Telephone Encounter (Signed)
Dr Laney Pastor, pt was given notice on last RFs in Nov that OV was needed. She has been in twice since, but not for these meds. Last labs by you on 10/22/12. Can we RF or RTC?

## 2013-09-21 ENCOUNTER — Encounter: Payer: Self-pay | Admitting: Pulmonary Disease

## 2013-09-21 DIAGNOSIS — R059 Cough, unspecified: Secondary | ICD-10-CM

## 2013-09-21 DIAGNOSIS — R05 Cough: Secondary | ICD-10-CM

## 2013-09-21 MED ORDER — OMEPRAZOLE 40 MG PO CPDR: 40.0000 mg | DELAYED_RELEASE_CAPSULE | Freq: Two times a day (BID) | ORAL | Status: DC

## 2013-09-24 ENCOUNTER — Ambulatory Visit (INDEPENDENT_AMBULATORY_CARE_PROVIDER_SITE_OTHER)
Admission: RE | Admit: 2013-09-24 | Discharge: 2013-09-24 | Disposition: A | Discharge status: Home / Self Care | Payer: Medicare Other | Source: Ambulatory Visit | Attending: Pulmonary Disease | Admitting: Pulmonary Disease

## 2013-09-24 DIAGNOSIS — R059 Cough, unspecified: Secondary | ICD-10-CM

## 2013-09-24 DIAGNOSIS — R9389 Abnormal findings on diagnostic imaging of other specified body structures: Secondary | ICD-10-CM

## 2013-09-24 DIAGNOSIS — R05 Cough: Secondary | ICD-10-CM

## 2013-09-24 DIAGNOSIS — R053 Chronic cough: Secondary | ICD-10-CM

## 2013-09-24 DIAGNOSIS — R918 Other nonspecific abnormal finding of lung field: Secondary | ICD-10-CM

## 2013-09-30 ENCOUNTER — Other Ambulatory Visit: Payer: Self-pay | Admitting: Emergency Medicine

## 2013-09-30 ENCOUNTER — Other Ambulatory Visit: Payer: Self-pay | Admitting: *Deleted

## 2013-09-30 DIAGNOSIS — I6529 Occlusion and stenosis of unspecified carotid artery: Secondary | ICD-10-CM

## 2013-10-02 ENCOUNTER — Encounter: Payer: Self-pay | Admitting: Internal Medicine

## 2013-10-03 MED ORDER — LEVOTHYROXINE SODIUM 150 MCG PO TABS: 150.0000 ug | ORAL_TABLET | Freq: Every day | ORAL | Status: DC

## 2013-10-03 NOTE — Telephone Encounter (Signed)
Meds ordered this encounter  Medications  . levothyroxine (SYNTHROID, LEVOTHROID) 150 MCG tablet    Sig: Take 1 tablet (150 mcg total) by mouth daily.    Dispense:  30 tablet    Refill:  0

## 2013-10-19 ENCOUNTER — Other Ambulatory Visit: Payer: Self-pay | Admitting: Internal Medicine

## 2013-11-26 ENCOUNTER — Encounter: Payer: Self-pay | Admitting: Internal Medicine

## 2013-12-02 ENCOUNTER — Other Ambulatory Visit: Payer: Self-pay | Admitting: Internal Medicine

## 2013-12-07 ENCOUNTER — Other Ambulatory Visit: Payer: Self-pay | Admitting: Internal Medicine

## 2013-12-08 NOTE — Telephone Encounter (Signed)
Faxed

## 2013-12-31 ENCOUNTER — Other Ambulatory Visit: Payer: Self-pay | Admitting: Internal Medicine

## 2014-01-19 ENCOUNTER — Other Ambulatory Visit: Payer: Self-pay | Admitting: Internal Medicine

## 2014-01-20 NOTE — Telephone Encounter (Signed)
Dr Laney Pastor, pt has appt 02/24/14. Do you want to OK a 90 day RF on meds through mail order?

## 2014-02-03 ENCOUNTER — Other Ambulatory Visit: Payer: Self-pay | Admitting: Neurosurgery

## 2014-02-05 ENCOUNTER — Encounter: Payer: Self-pay | Admitting: Internal Medicine

## 2014-02-05 ENCOUNTER — Other Ambulatory Visit: Payer: Self-pay | Admitting: Internal Medicine

## 2014-02-05 MED ORDER — METFORMIN HCL ER 500 MG PO TB24
500.0000 mg | ORAL_TABLET | Freq: Two times a day (BID) | ORAL | Status: DC
Start: 2014-02-05 — End: 2014-03-30

## 2014-02-10 NOTE — Pre-Procedure Instructions (Signed)
Alexis King  02/10/2014   Your procedure is scheduled on: Thursday, February 25, 2014  Report to [facility] Admitting at 8:15 AM.  Call this number if you have problems the morning of surgery: [phone]   Remember:    Do not eat food or drink liquids after midnight Wednesday, February 24, 2014    Take these medicines the morning of surgery with A SIP OF WATER: buPROPion (WELLBUTRIN,  levothyroxine (SYNTHROID), omeprazole (PRILOSEC), sertraline (ZOLOFT) , fluticasone (FLONASE) nasal spray, if needed:albuterol (PROVENTIL) inhaler for cough, shortness of breath or wheezing.). Bring inhaler in with you on the day of your procedure. Stop taking Aspirin, vitamins and herbal medications ( Krill oil).  Do not take any NSAIDs ie: Ibuprofen, Advil, Naproxen or any medication containing Aspirin.   Do not wear jewelry, make-up or nail polish.  Do not wear lotions, powders, or perfumes. You may wear deodorant.  Do not shave 48 hours prior to surgery.   Do not bring valuables to the hospital.  [facility] is not responsible for any belongings or valuables.               Contacts, dentures or bridgework may not be worn into surgery.  Leave suitcase in the car. After surgery it may be brought to your room.  For patients admitted to the hospital, discharge time is determined by your treatment team.               Patients discharged the day of surgery will not be allowed to drive home.  Name and phone number of your driver:   Special Instructions:  Special Instructions:Special Instructions: [facility] - Preparing for Surgery  Before surgery, you can play an important role.  Because skin is not sterile, your skin needs to be as free of germs as possible.  You can reduce the number of germs on you skin by washing with CHG (chlorahexidine gluconate) soap before surgery.  CHG is an antiseptic cleaner which kills germs and bonds with the skin to continue killing germs even after washing.  Please  DO NOT use if you have an allergy to CHG or antibacterial soaps.  If your skin becomes reddened/irritated stop using the CHG and inform your nurse when you arrive at Short Stay.  Do not shave (including legs and underarms) for at least 48 hours prior to the first CHG shower.  You may shave your face.  Please follow these instructions carefully:   1.  Shower with CHG Soap the night before surgery and the morning of Surgery.  2.  If you choose to wash your hair, wash your hair first as usual with your normal shampoo.  3.  After you shampoo, rinse your hair and body thoroughly to remove the Shampoo.  4.  Use CHG as you would any other liquid soap.  You can apply chg directly  to the skin and wash gently with scrungie or a clean washcloth.  5.  Apply the CHG Soap to your body ONLY FROM THE NECK DOWN.  Do not use on open wounds or open sores.  Avoid contact with your eyes, ears, mouth and genitals (private parts).  Wash genitals (private parts) with your normal soap.  6.  Wash thoroughly, paying special attention to the area where your surgery will be performed.  7.  Thoroughly rinse your body with warm water from the neck down.  8.  DO NOT shower/wash with your normal soap after using and rinsing off the  CHG Soap.  9.  Pat yourself dry with a clean towel.            10.  Wear clean pajamas.            11.  Place clean sheets on your bed the night of your first shower and do not sleep with pets.  Day of Surgery  Do not apply any lotions the morning of surgery.  Please wear clean clothes to the hospital/surgery center.   Please read over the following fact sheets that you were given: Pain Booklet, Coughing and Deep Breathing, Blood Transfusion Information, MRSA Information and Surgical Site Infection Prevention

## 2014-02-11 ENCOUNTER — Encounter (HOSPITAL_COMMUNITY): Payer: Self-pay

## 2014-02-11 ENCOUNTER — Encounter (HOSPITAL_COMMUNITY)
Admission: RE | Admit: 2014-02-11 | Discharge: 2014-02-11 | Disposition: A | Discharge status: Home / Self Care | Payer: Medicare Other | Source: Ambulatory Visit | Attending: Anesthesiology | Admitting: Anesthesiology

## 2014-02-11 ENCOUNTER — Encounter (HOSPITAL_COMMUNITY): Payer: Self-pay | Admitting: Pharmacy Technician

## 2014-02-11 ENCOUNTER — Encounter (HOSPITAL_COMMUNITY)
Admission: RE | Admit: 2014-02-11 | Discharge: 2014-02-11 | Disposition: A | Discharge status: Home / Self Care | Payer: Medicare Other | Source: Ambulatory Visit | Attending: Neurosurgery | Admitting: Neurosurgery

## 2014-02-11 DIAGNOSIS — Z01818 Encounter for other preprocedural examination: Secondary | ICD-10-CM | POA: Insufficient documentation

## 2014-02-11 DIAGNOSIS — Z01812 Encounter for preprocedural laboratory examination: Secondary | ICD-10-CM | POA: Insufficient documentation

## 2014-02-11 DIAGNOSIS — Z01810 Encounter for preprocedural cardiovascular examination: Secondary | ICD-10-CM | POA: Insufficient documentation

## 2014-02-11 HISTORY — DX: Hypothyroidism, unspecified: E03.9

## 2014-02-11 HISTORY — DX: Personal history of other diseases of the digestive system: Z87.19

## 2014-02-11 HISTORY — DX: Atherosclerotic heart disease of native coronary artery without angina pectoris: I25.10

## 2014-02-11 HISTORY — DX: Gastro-esophageal reflux disease without esophagitis: K21.9

## 2014-02-11 HISTORY — DX: Cardiac arrhythmia, unspecified: I49.9

## 2014-02-11 HISTORY — DX: Unspecified osteoarthritis, unspecified site: M19.90

## 2014-02-11 HISTORY — DX: Pneumonia, unspecified organism: J18.9

## 2014-02-11 LAB — TYPE AND SCREEN
ABO/RH(D): A POS
Antibody Screen: NEGATIVE

## 2014-02-11 LAB — CBC
HCT: 39.4 % (ref 36.0–46.0)
Hemoglobin: 13.7 g/dL (ref 12.0–15.0)
MCH: 32 pg (ref 26.0–34.0)
MCHC: 34.8 g/dL (ref 30.0–36.0)
MCV: 92.1 fL (ref 78.0–100.0)
Platelets: 203 10*3/uL (ref 150–400)
RBC: 4.28 MIL/uL (ref 3.87–5.11)
RDW: 12.8 % (ref 11.5–15.5)
WBC: 6.5 10*3/uL (ref 4.0–10.5)

## 2014-02-11 LAB — ABO/RH: ABO/RH(D): A POS

## 2014-02-11 LAB — BASIC METABOLIC PANEL
BUN: 20 mg/dL (ref 6–23)
CALCIUM: 10 mg/dL (ref 8.4–10.5)
CO2: 24 meq/L (ref 19–32)
CREATININE: 1.02 mg/dL (ref 0.50–1.10)
Chloride: 101 mEq/L (ref 96–112)
GFR calc Af Amer: 64 mL/min — ABNORMAL LOW (ref >90)
GFR, EST NON AFRICAN AMERICAN: 55 mL/min — AB (ref >90)
Glucose, Bld: 159 mg/dL — ABNORMAL HIGH (ref 70–99)
Potassium: 4.3 mEq/L (ref 3.7–5.3)
Sodium: 140 mEq/L (ref 137–147)

## 2014-02-11 LAB — SURGICAL PCR SCREEN
MRSA, PCR: POSITIVE — AB
Staphylococcus aureus: POSITIVE — AB

## 2014-02-11 NOTE — Progress Notes (Signed)
Patient and Alexis King at Dr Sande Rives office made aware that nasal swab was positive for MRSA and staph. Script called to [employer]. Patient verbalized instructions.

## 2014-02-16 ENCOUNTER — Other Ambulatory Visit: Payer: Self-pay | Admitting: Internal Medicine

## 2014-02-18 ENCOUNTER — Other Ambulatory Visit: Payer: Self-pay | Admitting: Neurosurgery

## 2014-02-24 ENCOUNTER — Encounter: Payer: Self-pay | Admitting: Internal Medicine

## 2014-02-24 ENCOUNTER — Ambulatory Visit (INDEPENDENT_AMBULATORY_CARE_PROVIDER_SITE_OTHER): Payer: Medicare Other | Admitting: Internal Medicine

## 2014-02-24 VITALS — BP 154/82 | HR 93 | Temp 98.2°F | Resp 16 | Ht 61.75 in | Wt 230.0 lb

## 2014-02-24 DIAGNOSIS — E785 Hyperlipidemia, unspecified: Secondary | ICD-10-CM

## 2014-02-24 DIAGNOSIS — Z6841 Body Mass Index (BMI) 40.0 and over, adult: Secondary | ICD-10-CM

## 2014-02-24 DIAGNOSIS — I10 Essential (primary) hypertension: Secondary | ICD-10-CM

## 2014-02-24 DIAGNOSIS — F329 Major depressive disorder, single episode, unspecified: Secondary | ICD-10-CM

## 2014-02-24 DIAGNOSIS — E1165 Type 2 diabetes mellitus with hyperglycemia: Principal | ICD-10-CM

## 2014-02-24 DIAGNOSIS — E039 Hypothyroidism, unspecified: Secondary | ICD-10-CM

## 2014-02-24 DIAGNOSIS — F32A Depression, unspecified: Secondary | ICD-10-CM

## 2014-02-24 DIAGNOSIS — IMO0001 Reserved for inherently not codable concepts without codable children: Secondary | ICD-10-CM

## 2014-02-24 DIAGNOSIS — F3289 Other specified depressive episodes: Secondary | ICD-10-CM

## 2014-02-24 DIAGNOSIS — E042 Nontoxic multinodular goiter: Secondary | ICD-10-CM

## 2014-02-24 HISTORY — PX: ROOT CANAL: SHX2363

## 2014-02-24 LAB — POCT GLYCOSYLATED HEMOGLOBIN (HGB A1C): Hemoglobin A1C: 7.8

## 2014-02-24 MED ORDER — VANCOMYCIN HCL 10 G IV SOLR
1500.0000 mg | INTRAVENOUS | Status: AC
  Administered 2014-02-25: 1500 mg via INTRAVENOUS
  Filled 2014-02-24: qty 1500

## 2014-02-24 MED ORDER — DEXAMETHASONE SODIUM PHOSPHATE 10 MG/ML IJ SOLN
10.0000 mg | INTRAMUSCULAR | Status: AC
  Administered 2014-02-25: 10 mg via INTRAVENOUS
  Filled 2014-02-24: qty 1

## 2014-02-24 NOTE — Progress Notes (Signed)
Subjective:  This chart was scribed for Alexis. Linton Ham. Alexis Pastor, MD  by Stacy Gardner, [facility] Scribe. The patient was seen in room and the patient's care was started at 1:35 PM.   Patient ID: Alexis King, female    DOB: 1946/02/16, 68 y.o.   MRN: [id number]  02/24/2014  Follow-up   HPI HPI Comments: Alexis King is a 68 y.o. female who arrives to the [facility] for a follow up.She requests a medication refill. She has a past medical hx of IBS, HTN, CAD, dysrhythmia, and hyperlipidemiat is taking Metformin  Which was incr to bid  two weeks ago. . Pt was told that she gained 10 lbs since last year.  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  It has been 10 years since her last tetanus shot.  Denies ever having a colonoscopy.  Pt had two root canals this morning by Alexis. Kathlen Mody.  She is going to have a three level fusion surgery tomorrow. Alexis King She had a nasal swab that indicated the presence of MRSA 1 mo ago, and took antibiotics to treat it.  Pt attributes her weight gain to the stress of her husband's ailment.  She requests refills for Wellbutrin.   Denies numbness and burning to her feet.   Patient Active Problem List   Diagnosis Date Noted  . Chronic cough 09/16/2013  . Abnormal chest x-ray 09/16/2013  . HTN (hypertension) 10/22/2012  . Other and unspecified hyperlipidemia 10/22/2012  . Depression 10/22/2012  . Multiple thyroid nodules 10/22/2012  . BMI 40.0-44.9, adult 10/22/2012  . Unspecified hypothyroidism 10/22/2012  . DM (diabetes mellitus) 10/22/2012  . Lumbar spondylosis with myelopathy 10/22/2012    Review of Systems  Musculoskeletal: Negative for arthralgias and myalgias.  Neurological: Negative for numbness.  no CP-See Alexis King w/u   Allergies  Allergen Reactions  . Ace Inhibitors Cough  . Clindamycin/Lincomycin     Severe stomach  cramps  . Levaquin [Levofloxacin In D5w] Other (See Comments)    Achillis tendon pain  . Meloxicam Nausea Only    Stomach Cramps  . Sulfa Antibiotics Rash   Current Outpatient Prescriptions  Medication Sig Dispense Refill  . amLODipine (NORVASC) 10 MG tablet Take 5 mg by mouth daily.      Marland Kitchen aspirin EC 81 MG tablet Take 81 mg by mouth daily.      Marland Kitchen atorvastatin (LIPITOR) 40 MG tablet Take 40 mg by mouth at bedtime.      . Biotin 10 MG TABS Take 10 mg by mouth daily.      Marland Kitchen buPROPion (WELLBUTRIN XL) 300 MG 24 hr tablet Take 1 tablet by mouth  daily (patient needs office for additional refills)  90 tablet  0  . Calcium-Magnesium-Vitamin D (CALCIUM MAGNESIUM PO) Take 1 capsule by mouth daily.      . carvedilol (COREG) 6.25 MG tablet Take 6.25 mg by mouth 2 (two) times daily with a meal.      . cholecalciferol (VITAMIN D) 1000 UNITS tablet Take 1,000 Units by mouth daily.       . clonazePAM (KLONOPIN) 0.5 MG tablet Take 0.5 mg by mouth at bedtime as needed (sleep).      . fluticasone (FLONASE) 50 MCG/ACT nasal spray Place 2 sprays into both nostrils at bedtime.      Marland Kitchen HYDROcodone-acetaminophen (NORCO/VICODIN) 5-325 MG per tablet Take 1  tablet by mouth every 6 (six) hours as needed for moderate pain.      Javier Docker Oil 500 MG CAPS Take 500 mg by mouth daily.       Marland Kitchen levothyroxine (SYNTHROID, LEVOTHROID) 150 MCG tablet Take 1 tablet (150 mcg total) by mouth daily.  30 tablet  0  . losartan-hydrochlorothiazide (HYZAAR) 100-12.5 MG per tablet Take 1 tablet by mouth daily.      . metFORMIN (GLUCOPHAGE-XR) 500 MG 24 hr tablet Take 1 tablet (500 mg total) by mouth 2 (two) times daily.  180 tablet  0  . minoxidil (ROGAINE) 2 % external solution Apply 1 application topically at bedtime.       . Multiple Vitamins-Minerals (MULTIVITAMIN WITH MINERALS) tablet Take 1 tablet by mouth daily.      . naproxen sodium (ANAPROX) 220 MG tablet Take 220-440 mg by mouth 2 (two) times daily with a meal. 220mg  in the  morning and 440 at bedtime.      Vladimir Faster Glycol-Propyl Glycol (SYSTANE OP) Place 1 drop into both eyes daily as needed (dry eyes).       . sertraline (ZOLOFT) 100 MG tablet Take 100 mg by mouth daily.      Marland Kitchen spironolactone (ALDACTONE) 25 MG tablet Take 25 mg by mouth daily.      . vitamin C (ASCORBIC ACID) 500 MG tablet Take 500 mg by mouth daily.       Current Facility-Administered Medications  Medication Dose Route Frequency Provider Last Rate Last Dose  . albuterol (PROVENTIL) (2.5 MG/3ML) 0.083% nebulizer solution 5 mg  5 mg Nebulization Once Ellison Carwin, MD      . ipratropium (ATROVENT) nebulizer solution 0.5 mg  0.5 mg Nebulization Once Ellison Carwin, MD           Objective:    BP 154/82  Pulse 93  Temp(Src) 98.2 F (36.8 C) (Oral)  Resp 16  Ht 5' 1.75" (1.568 m)  Wt 230 lb (104.327 kg)  BMI 42.43 kg/m2  SpO2 92%    Physical Exam  Nursing note and vitals reviewed. Constitutional: She is oriented to person, place, and time. She appears well-developed and well-nourished. No distress.  HENT:  Head: Normocephalic and atraumatic.  Eyes: Conjunctivae and EOM are normal. Pupils are equal, round, and reactive to light.  Neck: Neck supple. No thyromegaly present.  Cardiovascular: Normal rate, regular rhythm, normal heart sounds and intact distal pulses.   No murmur heard. Pulmonary/Chest: Effort normal. No respiratory distress.  Musculoskeletal: Normal range of motion.  sens int LE  Lymphadenopathy:    She has no cervical adenopathy.  Neurological: She is alert and oriented to person, place, and time.  Skin: Skin is warm and dry.  Psychiatric: She has a normal mood and affect. Her behavior is normal.          Assessment & Plan:   Type II or unspecified type diabetes mellitus without mention of complication, uncontrolled - Plan: POCT glycosylated hemoglobin (Hb A1C)  Other and unspecified hyperlipidemia - Plan: Lipid panel  Unspecified hypothyroidism -  Plan: TSH, T4, free  Essential hypertension  Multiple thyroid nodules - Plan: TSH, T4, free  Depression  BMI 40.0-44.9, adult  F/u 3 mos to test A1c at this new dose--she has been on twice this before wt loss Work on wt/diet   I have completed the patient encounter in its entirety as documented by the scribe, with editing by me where necessary. Vennesa Bastedo P. Alexis King, M.D.  Add= Results  for orders placed in visit on 02/24/14  LIPID PANEL      Result Value Ref Range   Cholesterol 146  0 - 200 mg/dL   Triglycerides 313 (*) <150 mg/dL   HDL 39 (*) >39 mg/dL   Total CHOL/HDL Ratio 3.7     VLDL 63 (*) 0 - 40 mg/dL   LDL Cholesterol 44  0 - 99 mg/dL  TSH      Result Value Ref Range   TSH 4.094  0.350 - 4.500 uIU/mL  T4, FREE      Result Value Ref Range   Free T4 1.34  0.80 - 1.80 ng/dL  POCT GLYCOSYLATED HEMOGLOBIN (HGB A1C)      Result Value Ref Range   Hemoglobin A1C 7.8

## 2014-02-25 ENCOUNTER — Inpatient Hospital Stay (HOSPITAL_COMMUNITY): Payer: Medicare Other | Admitting: Anesthesiology

## 2014-02-25 ENCOUNTER — Inpatient Hospital Stay (HOSPITAL_COMMUNITY)
Admission: RE | Admit: 2014-02-25 | Discharge: 2014-03-04 | DRG: 453 | Disposition: A | Discharge status: Skilled Nursing Facility | Payer: Medicare Other | Source: Ambulatory Visit | Attending: Neurosurgery | Admitting: Neurosurgery

## 2014-02-25 ENCOUNTER — Encounter (HOSPITAL_COMMUNITY): Payer: Self-pay | Admitting: Anesthesiology

## 2014-02-25 ENCOUNTER — Encounter (HOSPITAL_COMMUNITY): Payer: Medicare Other | Admitting: Anesthesiology

## 2014-02-25 ENCOUNTER — Inpatient Hospital Stay (HOSPITAL_COMMUNITY): Payer: Medicare Other

## 2014-02-25 ENCOUNTER — Encounter (HOSPITAL_COMMUNITY)
Admission: RE | Disposition: A | Discharge status: Skilled Nursing Facility | Payer: Self-pay | Source: Ambulatory Visit | Attending: Neurosurgery

## 2014-02-25 DIAGNOSIS — N390 Urinary tract infection, site not specified: Secondary | ICD-10-CM | POA: Diagnosis not present

## 2014-02-25 DIAGNOSIS — Z7982 Long term (current) use of aspirin: Secondary | ICD-10-CM

## 2014-02-25 DIAGNOSIS — J9601 Acute respiratory failure with hypoxia: Secondary | ICD-10-CM | POA: Diagnosis present

## 2014-02-25 DIAGNOSIS — M129 Arthropathy, unspecified: Secondary | ICD-10-CM | POA: Diagnosis present

## 2014-02-25 DIAGNOSIS — Z79899 Other long term (current) drug therapy: Secondary | ICD-10-CM | POA: Diagnosis not present

## 2014-02-25 DIAGNOSIS — D72829 Elevated white blood cell count, unspecified: Secondary | ICD-10-CM | POA: Diagnosis present

## 2014-02-25 DIAGNOSIS — M545 Low back pain, unspecified: Secondary | ICD-10-CM | POA: Diagnosis present

## 2014-02-25 DIAGNOSIS — N179 Acute kidney failure, unspecified: Secondary | ICD-10-CM | POA: Diagnosis not present

## 2014-02-25 DIAGNOSIS — F3289 Other specified depressive episodes: Secondary | ICD-10-CM | POA: Diagnosis present

## 2014-02-25 DIAGNOSIS — J9600 Acute respiratory failure, unspecified whether with hypoxia or hypercapnia: Secondary | ICD-10-CM | POA: Diagnosis not present

## 2014-02-25 DIAGNOSIS — I2510 Atherosclerotic heart disease of native coronary artery without angina pectoris: Secondary | ICD-10-CM | POA: Diagnosis present

## 2014-02-25 DIAGNOSIS — M4316 Spondylolisthesis, lumbar region: Secondary | ICD-10-CM | POA: Diagnosis present

## 2014-02-25 DIAGNOSIS — I959 Hypotension, unspecified: Secondary | ICD-10-CM | POA: Diagnosis not present

## 2014-02-25 DIAGNOSIS — I10 Essential (primary) hypertension: Secondary | ICD-10-CM | POA: Diagnosis present

## 2014-02-25 DIAGNOSIS — M479 Spondylosis, unspecified: Secondary | ICD-10-CM | POA: Diagnosis present

## 2014-02-25 DIAGNOSIS — Q762 Congenital spondylolisthesis: Principal | ICD-10-CM

## 2014-02-25 DIAGNOSIS — E119 Type 2 diabetes mellitus without complications: Secondary | ICD-10-CM | POA: Diagnosis present

## 2014-02-25 DIAGNOSIS — E860 Dehydration: Secondary | ICD-10-CM

## 2014-02-25 DIAGNOSIS — E039 Hypothyroidism, unspecified: Secondary | ICD-10-CM | POA: Diagnosis present

## 2014-02-25 DIAGNOSIS — E871 Hypo-osmolality and hyponatremia: Secondary | ICD-10-CM | POA: Diagnosis not present

## 2014-02-25 DIAGNOSIS — Z6841 Body Mass Index (BMI) 40.0 and over, adult: Secondary | ICD-10-CM

## 2014-02-25 DIAGNOSIS — Z85828 Personal history of other malignant neoplasm of skin: Secondary | ICD-10-CM

## 2014-02-25 DIAGNOSIS — K219 Gastro-esophageal reflux disease without esophagitis: Secondary | ICD-10-CM | POA: Diagnosis present

## 2014-02-25 DIAGNOSIS — I9589 Other hypotension: Secondary | ICD-10-CM

## 2014-02-25 DIAGNOSIS — G9340 Encephalopathy, unspecified: Secondary | ICD-10-CM | POA: Diagnosis not present

## 2014-02-25 DIAGNOSIS — F329 Major depressive disorder, single episode, unspecified: Secondary | ICD-10-CM | POA: Diagnosis present

## 2014-02-25 DIAGNOSIS — E785 Hyperlipidemia, unspecified: Secondary | ICD-10-CM | POA: Diagnosis present

## 2014-02-25 HISTORY — PX: TRANSFORAMINAL LUMBAR INTERBODY FUSION (TLIF) WITH PEDICLE SCREW FIXATION 1 LEVEL: SHX6141

## 2014-02-25 HISTORY — PX: LUMBAR PERCUTANEOUS PEDICLE SCREW 3 LEVEL: SHX5562

## 2014-02-25 HISTORY — PX: ANTERIOR LAT LUMBAR FUSION: SHX1168

## 2014-02-25 LAB — GLUCOSE, CAPILLARY
GLUCOSE-CAPILLARY: 179 mg/dL — AB (ref 70–99)
GLUCOSE-CAPILLARY: 270 mg/dL — AB (ref 70–99)
Glucose-Capillary: 269 mg/dL — ABNORMAL HIGH (ref 70–99)

## 2014-02-25 LAB — LIPID PANEL
CHOL/HDL RATIO: 3.7 ratio
Cholesterol: 146 mg/dL (ref 0–200)
HDL: 39 mg/dL — AB (ref >39)
LDL CALC: 44 mg/dL (ref 0–99)
Triglycerides: 313 mg/dL — ABNORMAL HIGH (ref <150)
VLDL: 63 mg/dL — AB (ref 0–40)

## 2014-02-25 LAB — TSH: TSH: 4.094 u[IU]/mL (ref 0.350–4.500)

## 2014-02-25 LAB — T4, FREE: Free T4: 1.34 ng/dL (ref 0.80–1.80)

## 2014-02-25 SURGERY — ANTERIOR LATERAL LUMBAR FUSION 3 LEVELS
Anesthesia: General | Site: Spine Lumbar

## 2014-02-25 MED ORDER — EPHEDRINE SULFATE 50 MG/ML IJ SOLN
INTRAMUSCULAR | Status: DC | PRN
  Administered 2014-02-25 (×5): 10 mg via INTRAVENOUS

## 2014-02-25 MED ORDER — CLONAZEPAM 0.5 MG PO TABS
0.5000 mg | ORAL_TABLET | Freq: Every evening | ORAL | Status: DC | PRN
  Administered 2014-02-27 – 2014-03-02 (×3): 0.5 mg via ORAL
  Filled 2014-02-25 (×3): qty 1

## 2014-02-25 MED ORDER — AMLODIPINE BESYLATE 5 MG PO TABS
5.0000 mg | ORAL_TABLET | Freq: Every day | ORAL | Status: DC
  Administered 2014-02-26 – 2014-02-28 (×3): 5 mg via ORAL
  Filled 2014-02-25 (×3): qty 1

## 2014-02-25 MED ORDER — GLYCOPYRROLATE 0.2 MG/ML IJ SOLN
INTRAMUSCULAR | Status: DC | PRN
  Administered 2014-02-25 (×2): 0.2 mg via INTRAVENOUS

## 2014-02-25 MED ORDER — PHENOL 1.4 % MT LIQD: 1.0000 | OROMUCOSAL | Status: DC | PRN

## 2014-02-25 MED ORDER — SODIUM CHLORIDE 0.9 % IJ SOLN: 3.0000 mL | INTRAMUSCULAR | Status: DC | PRN

## 2014-02-25 MED ORDER — SODIUM CHLORIDE 0.9 % IV SOLN: 250.0000 mL | INTRAVENOUS | Status: DC

## 2014-02-25 MED ORDER — FLUTICASONE PROPIONATE 50 MCG/ACT NA SUSP
2.0000 | Freq: Every day | NASAL | Status: DC
  Administered 2014-02-26 – 2014-03-03 (×6): 2 via NASAL
  Filled 2014-02-25: qty 16

## 2014-02-25 MED ORDER — PANTOPRAZOLE SODIUM 40 MG IV SOLR
40.0000 mg | Freq: Every day | INTRAVENOUS | Status: DC
  Administered 2014-02-25: 40 mg via INTRAVENOUS
  Filled 2014-02-25 (×2): qty 40

## 2014-02-25 MED ORDER — BUPIVACAINE HCL (PF) 0.5 % IJ SOLN
INTRAMUSCULAR | Status: DC | PRN
  Administered 2014-02-25: 29 mL

## 2014-02-25 MED ORDER — FENTANYL CITRATE 0.05 MG/ML IJ SOLN
INTRAMUSCULAR | Status: AC
Start: 2014-02-25 — End: 2014-02-26
  Filled 2014-02-25: qty 2

## 2014-02-25 MED ORDER — PHENYLEPHRINE HCL 10 MG/ML IJ SOLN
INTRAMUSCULAR | Status: DC | PRN
  Administered 2014-02-25: 80 ug via INTRAVENOUS
  Administered 2014-02-25: 120 ug via INTRAVENOUS

## 2014-02-25 MED ORDER — THROMBIN 20000 UNITS EX SOLR
CUTANEOUS | Status: DC | PRN
  Administered 2014-02-25: 12:00:00 via TOPICAL

## 2014-02-25 MED ORDER — CARVEDILOL 6.25 MG PO TABS
6.2500 mg | ORAL_TABLET | Freq: Once | ORAL | Status: AC
  Administered 2014-02-25: 6.25 mg via ORAL
  Filled 2014-02-25: qty 1

## 2014-02-25 MED ORDER — ROCURONIUM BROMIDE 50 MG/5ML IV SOLN
INTRAVENOUS | Status: AC
  Filled 2014-02-25: qty 1

## 2014-02-25 MED ORDER — CARVEDILOL 6.25 MG PO TABS
6.2500 mg | ORAL_TABLET | Freq: Two times a day (BID) | ORAL | Status: DC
  Administered 2014-02-26 – 2014-02-28 (×5): 6.25 mg via ORAL
  Filled 2014-02-25 (×7): qty 1

## 2014-02-25 MED ORDER — LIDOCAINE HCL (CARDIAC) 20 MG/ML IV SOLN
INTRAVENOUS | Status: AC
  Filled 2014-02-25: qty 5

## 2014-02-25 MED ORDER — LACTATED RINGERS IV SOLN
INTRAVENOUS | Status: DC | PRN
  Administered 2014-02-25 (×5): via INTRAVENOUS

## 2014-02-25 MED ORDER — NEOSTIGMINE METHYLSULFATE 10 MG/10ML IV SOLN
INTRAVENOUS | Status: AC
  Filled 2014-02-25: qty 1

## 2014-02-25 MED ORDER — FENTANYL CITRATE 0.05 MG/ML IJ SOLN
INTRAMUSCULAR | Status: AC
  Filled 2014-02-25: qty 5

## 2014-02-25 MED ORDER — FENTANYL CITRATE 0.05 MG/ML IJ SOLN
50.0000 ug | INTRAMUSCULAR | Status: DC | PRN
  Administered 2014-02-25: 50 ug via INTRAVENOUS

## 2014-02-25 MED ORDER — ROCURONIUM BROMIDE 100 MG/10ML IV SOLN
INTRAVENOUS | Status: DC | PRN
  Administered 2014-02-25: 25 mg via INTRAVENOUS

## 2014-02-25 MED ORDER — LIDOCAINE HCL (CARDIAC) 20 MG/ML IV SOLN
INTRAVENOUS | Status: DC | PRN
  Administered 2014-02-25: 80 mg via INTRAVENOUS

## 2014-02-25 MED ORDER — PROPOFOL 10 MG/ML IV BOLUS
INTRAVENOUS | Status: DC | PRN
  Administered 2014-02-25: 40 mg via INTRAVENOUS
  Administered 2014-02-25: 160 mg via INTRAVENOUS

## 2014-02-25 MED ORDER — LOSARTAN POTASSIUM-HCTZ 100-12.5 MG PO TABS: 1.0000 | ORAL_TABLET | Freq: Every day | ORAL | Status: DC

## 2014-02-25 MED ORDER — NEOSTIGMINE METHYLSULFATE 10 MG/10ML IV SOLN
INTRAVENOUS | Status: DC | PRN
  Administered 2014-02-25: 2 mg via INTRAVENOUS

## 2014-02-25 MED ORDER — INSULIN ASPART 100 UNIT/ML ~~LOC~~ SOLN
4.0000 [IU] | Freq: Three times a day (TID) | SUBCUTANEOUS | Status: DC
  Administered 2014-02-26 (×3): 4 [IU] via SUBCUTANEOUS

## 2014-02-25 MED ORDER — PHENYLEPHRINE 40 MCG/ML (10ML) SYRINGE FOR IV PUSH (FOR BLOOD PRESSURE SUPPORT)
PREFILLED_SYRINGE | INTRAVENOUS | Status: AC
  Filled 2014-02-25: qty 10

## 2014-02-25 MED ORDER — FENTANYL CITRATE 0.05 MG/ML IJ SOLN
INTRAMUSCULAR | Status: AC
  Filled 2014-02-25: qty 2

## 2014-02-25 MED ORDER — SUCCINYLCHOLINE CHLORIDE 20 MG/ML IJ SOLN
INTRAMUSCULAR | Status: AC
  Filled 2014-02-25: qty 1

## 2014-02-25 MED ORDER — MENTHOL 3 MG MT LOZG
1.0000 | LOZENGE | OROMUCOSAL | Status: DC | PRN
  Filled 2014-02-25: qty 9

## 2014-02-25 MED ORDER — OXYCODONE-ACETAMINOPHEN 5-325 MG PO TABS
1.0000 | ORAL_TABLET | ORAL | Status: DC | PRN
  Administered 2014-02-26: 1 via ORAL
  Filled 2014-02-25: qty 1

## 2014-02-25 MED ORDER — 0.9 % SODIUM CHLORIDE (POUR BTL) OPTIME
TOPICAL | Status: DC | PRN
  Administered 2014-02-25: 1000 mL

## 2014-02-25 MED ORDER — SERTRALINE HCL 100 MG PO TABS
100.0000 mg | ORAL_TABLET | Freq: Every day | ORAL | Status: DC
  Administered 2014-02-26 – 2014-03-04 (×7): 100 mg via ORAL
  Filled 2014-02-25 (×7): qty 1

## 2014-02-25 MED ORDER — INSULIN ASPART 100 UNIT/ML ~~LOC~~ SOLN: 0.0000 [IU] | Freq: Every day | SUBCUTANEOUS | Status: DC

## 2014-02-25 MED ORDER — LACTATED RINGERS IV SOLN
INTRAVENOUS | Status: DC
  Administered 2014-02-25: 09:00:00 via INTRAVENOUS

## 2014-02-25 MED ORDER — BIOTENE DRY MOUTH MT LIQD
15.0000 mL | Freq: Two times a day (BID) | OROMUCOSAL | Status: DC
Start: 2014-02-25 — End: 2014-02-26
  Administered 2014-02-25: 15 mL via OROMUCOSAL

## 2014-02-25 MED ORDER — ONDANSETRON HCL 4 MG/2ML IJ SOLN
INTRAMUSCULAR | Status: DC | PRN
  Administered 2014-02-25: 4 mg via INTRAVENOUS

## 2014-02-25 MED ORDER — MIDAZOLAM HCL 2 MG/2ML IJ SOLN
INTRAMUSCULAR | Status: AC
  Filled 2014-02-25: qty 2

## 2014-02-25 MED ORDER — ONDANSETRON HCL 4 MG/2ML IJ SOLN
4.0000 mg | INTRAMUSCULAR | Status: DC | PRN
  Administered 2014-02-27 – 2014-03-04 (×2): 4 mg via INTRAVENOUS
  Filled 2014-02-25 (×2): qty 2

## 2014-02-25 MED ORDER — ACETAMINOPHEN 325 MG PO TABS: 650.0000 mg | ORAL_TABLET | ORAL | Status: DC | PRN

## 2014-02-25 MED ORDER — LOSARTAN POTASSIUM 50 MG PO TABS
100.0000 mg | ORAL_TABLET | Freq: Every day | ORAL | Status: DC
  Administered 2014-02-26 – 2014-02-28 (×3): 100 mg via ORAL
  Filled 2014-02-25 (×3): qty 2

## 2014-02-25 MED ORDER — SUCCINYLCHOLINE CHLORIDE 20 MG/ML IJ SOLN
INTRAMUSCULAR | Status: DC | PRN
  Administered 2014-02-25: 120 mg via INTRAVENOUS

## 2014-02-25 MED ORDER — VANCOMYCIN HCL IN DEXTROSE 1-5 GM/200ML-% IV SOLN
1000.0000 mg | Freq: Once | INTRAVENOUS | Status: AC
  Administered 2014-02-25: 1000 mg via INTRAVENOUS
  Filled 2014-02-25: qty 200

## 2014-02-25 MED ORDER — FENTANYL CITRATE 0.05 MG/ML IJ SOLN
INTRAMUSCULAR | Status: DC | PRN
  Administered 2014-02-25: 50 ug via INTRAVENOUS
  Administered 2014-02-25: 100 ug via INTRAVENOUS
  Administered 2014-02-25 (×2): 50 ug via INTRAVENOUS
  Administered 2014-02-25: 100 ug via INTRAVENOUS
  Administered 2014-02-25 (×5): 50 ug via INTRAVENOUS

## 2014-02-25 MED ORDER — FENTANYL CITRATE 0.05 MG/ML IJ SOLN
25.0000 ug | INTRAMUSCULAR | Status: DC | PRN
  Administered 2014-02-25 (×3): 50 ug via INTRAVENOUS

## 2014-02-25 MED ORDER — SPIRONOLACTONE 25 MG PO TABS
25.0000 mg | ORAL_TABLET | Freq: Every day | ORAL | Status: DC
  Administered 2014-02-26 – 2014-02-28 (×3): 25 mg via ORAL
  Filled 2014-02-25 (×3): qty 1

## 2014-02-25 MED ORDER — INSULIN ASPART 100 UNIT/ML ~~LOC~~ SOLN
0.0000 [IU] | Freq: Three times a day (TID) | SUBCUTANEOUS | Status: DC
  Administered 2014-02-26: 5 [IU] via SUBCUTANEOUS
  Administered 2014-02-26: 2 [IU] via SUBCUTANEOUS
  Administered 2014-02-26: 3 [IU] via SUBCUTANEOUS

## 2014-02-25 MED ORDER — HYDROMORPHONE HCL PF 1 MG/ML IJ SOLN
1.0000 mg | INTRAMUSCULAR | Status: DC | PRN
  Administered 2014-02-26: 1 mg via INTRAMUSCULAR
  Filled 2014-02-25 (×2): qty 1

## 2014-02-25 MED ORDER — METFORMIN HCL ER 500 MG PO TB24
500.0000 mg | ORAL_TABLET | Freq: Two times a day (BID) | ORAL | Status: DC
  Administered 2014-02-26 – 2014-02-28 (×5): 500 mg via ORAL
  Filled 2014-02-25 (×7): qty 1

## 2014-02-25 MED ORDER — PROPOFOL 10 MG/ML IV BOLUS
INTRAVENOUS | Status: AC
  Filled 2014-02-25: qty 20

## 2014-02-25 MED ORDER — HYDROCHLOROTHIAZIDE 12.5 MG PO CAPS
12.5000 mg | ORAL_CAPSULE | Freq: Every day | ORAL | Status: DC
  Administered 2014-02-26 – 2014-02-28 (×3): 12.5 mg via ORAL
  Filled 2014-02-25 (×3): qty 1

## 2014-02-25 MED ORDER — DOCUSATE SODIUM 100 MG PO CAPS
100.0000 mg | ORAL_CAPSULE | Freq: Two times a day (BID) | ORAL | Status: DC
Start: 2014-02-25 — End: 2014-03-04
  Administered 2014-02-27 – 2014-03-03 (×4): 100 mg via ORAL
  Filled 2014-02-25 (×5): qty 1

## 2014-02-25 MED ORDER — POTASSIUM CHLORIDE IN NACL 20-0.45 MEQ/L-% IV SOLN
INTRAVENOUS | Status: DC
  Administered 2014-02-25 – 2014-02-26 (×2): via INTRAVENOUS
  Filled 2014-02-25 (×8): qty 1000

## 2014-02-25 MED ORDER — CARVEDILOL 3.125 MG PO TABS
ORAL_TABLET | ORAL | Status: AC
  Filled 2014-02-25: qty 2

## 2014-02-25 MED ORDER — ACETAMINOPHEN 650 MG RE SUPP: 650.0000 mg | RECTAL | Status: DC | PRN

## 2014-02-25 MED ORDER — LEVOTHYROXINE SODIUM 150 MCG PO TABS
150.0000 ug | ORAL_TABLET | Freq: Every day | ORAL | Status: DC
  Administered 2014-02-26 – 2014-03-04 (×7): 150 ug via ORAL
  Filled 2014-02-25 (×9): qty 1

## 2014-02-25 MED ORDER — MIDAZOLAM HCL 5 MG/5ML IJ SOLN
INTRAMUSCULAR | Status: DC | PRN
  Administered 2014-02-25: 2 mg via INTRAVENOUS

## 2014-02-25 MED ORDER — ONDANSETRON HCL 4 MG/2ML IJ SOLN
INTRAMUSCULAR | Status: AC
  Filled 2014-02-25: qty 2

## 2014-02-25 MED ORDER — SODIUM CHLORIDE 0.9 % IJ SOLN
3.0000 mL | Freq: Two times a day (BID) | INTRAMUSCULAR | Status: DC
  Administered 2014-02-26 – 2014-03-01 (×5): 3 mL via INTRAVENOUS

## 2014-02-25 MED ORDER — SODIUM CHLORIDE 0.9 % IR SOLN
Status: DC | PRN
Start: 2014-02-25 — End: 2014-02-25
  Administered 2014-02-25 (×2)

## 2014-02-25 SURGICAL SUPPLY — 85 items
BAG DECANTER FOR FLEXI CONT (MISCELLANEOUS) ×10 IMPLANT
BENZOIN TINCTURE PRP APPL 2/3 (GAUZE/BANDAGES/DRESSINGS) ×5 IMPLANT
BLADE 10 SAFETY STRL DISP (BLADE) IMPLANT
BLADE SURG ROTATE 9660 (MISCELLANEOUS) IMPLANT
BONE EQUIVA 10CC (Bone Implant) ×10 IMPLANT
BRUSH SCRUB EZ PLAIN DRY (MISCELLANEOUS) IMPLANT
BUR CUTTER 7.0 ROUND (BURR) ×5 IMPLANT
BUR MATCHSTICK NEURO 3.0 LAGG (BURR) ×5 IMPLANT
CANISTER SUCT 3000ML (MISCELLANEOUS) ×10 IMPLANT
CLOSURE WOUND 1/2 X4 (GAUZE/BANDAGES/DRESSINGS) ×3
CONT SPEC 4OZ CLIKSEAL STRL BL (MISCELLANEOUS) ×5 IMPLANT
COVER BACK TABLE 24X17X13 BIG (DRAPES) ×5 IMPLANT
COVER TABLE BACK 60X90 (DRAPES) IMPLANT
DERMABOND ADHESIVE PROPEN (GAUZE/BANDAGES/DRESSINGS) ×2
DERMABOND ADVANCED (GAUZE/BANDAGES/DRESSINGS)
DERMABOND ADVANCED .7 DNX12 (GAUZE/BANDAGES/DRESSINGS) IMPLANT
DERMABOND ADVANCED .7 DNX6 (GAUZE/BANDAGES/DRESSINGS) ×3 IMPLANT
DRAPE C-ARM 42X72 X-RAY (DRAPES) ×10 IMPLANT
DRAPE C-ARMOR (DRAPES) ×10 IMPLANT
DRAPE LAPAROTOMY 100X72X124 (DRAPES) ×10 IMPLANT
DRAPE SURG 17X23 STRL (DRAPES) ×20 IMPLANT
DRSG TELFA 3X8 NADH (GAUZE/BANDAGES/DRESSINGS) IMPLANT
DURAPREP 26ML APPLICATOR (WOUND CARE) ×10 IMPLANT
ELECT BLADE 4.0 EZ CLEAN MEGAD (MISCELLANEOUS) ×5
ELECT REM PT RETURN 9FT ADLT (ELECTROSURGICAL) ×10
ELECTRODE BLDE 4.0 EZ CLN MEGD (MISCELLANEOUS) ×3 IMPLANT
ELECTRODE REM PT RTRN 9FT ADLT (ELECTROSURGICAL) ×6 IMPLANT
EVACUATOR 1/8 PVC DRAIN (DRAIN) IMPLANT
GAUZE SPONGE 4X4 16PLY XRAY LF (GAUZE/BANDAGES/DRESSINGS) ×10 IMPLANT
GLOVE BIO SURGEON STRL SZ8 (GLOVE) ×5 IMPLANT
GLOVE BIOGEL PI IND STRL 7.0 (GLOVE) ×9 IMPLANT
GLOVE BIOGEL PI IND STRL 7.5 (GLOVE) ×6 IMPLANT
GLOVE BIOGEL PI INDICATOR 7.0 (GLOVE) ×6
GLOVE BIOGEL PI INDICATOR 7.5 (GLOVE) ×4
GLOVE ECLIPSE 7.5 STRL STRAW (GLOVE) ×15 IMPLANT
GLOVE ECLIPSE 8.0 STRL XLNG CF (GLOVE) ×10 IMPLANT
GLOVE EXAM NITRILE LRG STRL (GLOVE) IMPLANT
GLOVE EXAM NITRILE MD LF STRL (GLOVE) IMPLANT
GLOVE EXAM NITRILE XL STR (GLOVE) IMPLANT
GLOVE EXAM NITRILE XS STR PU (GLOVE) IMPLANT
GLOVE SURG SS PI 7.0 STRL IVOR (GLOVE) ×20 IMPLANT
GOWN STRL REUS W/ TWL LRG LVL3 (GOWN DISPOSABLE) ×6 IMPLANT
GOWN STRL REUS W/ TWL XL LVL3 (GOWN DISPOSABLE) ×15 IMPLANT
GOWN STRL REUS W/TWL 2XL LVL3 (GOWN DISPOSABLE) IMPLANT
GOWN STRL REUS W/TWL LRG LVL3 (GOWN DISPOSABLE) ×4
GOWN STRL REUS W/TWL XL LVL3 (GOWN DISPOSABLE) ×10
GUIDEWIRES (WIRE) ×10 IMPLANT
IMPLANT PEEK ARDIS 8X11X30MM (Orthopedic Implant) ×5 IMPLANT
K-WIRE NITHNOL TROCAR TIP (WIRE) ×40 IMPLANT
KIT ACCESS (KITS) ×5 IMPLANT
KIT BASIN OR (CUSTOM PROCEDURE TRAY) ×5 IMPLANT
KIT NEURO (KITS) ×5 IMPLANT
KIT ROOM TURNOVER OR (KITS) ×5 IMPLANT
NEEDLE HYPO 22GX1.5 SAFETY (NEEDLE) ×10 IMPLANT
NEEDLE TARGETING (NEEDLE) ×40 IMPLANT
NEURO MONITORING STIM (LABOR (TRAVEL & OVERTIME)) ×5 IMPLANT
NS IRRIG 1000ML POUR BTL (IV SOLUTION) ×5 IMPLANT
PACK LAMINECTOMY NEURO (CUSTOM PROCEDURE TRAY) ×10 IMPLANT
PAD ARMBOARD 7.5X6 YLW CONV (MISCELLANEOUS) ×20 IMPLANT
PATTIES SURGICAL .75X.75 (GAUZE/BANDAGES/DRESSINGS) IMPLANT
ROD PREBENT PERC 10MM (Rod) ×10 IMPLANT
SCREW MIN INVASIVE 6.5X45 (Screw) ×10 IMPLANT
SCREW POLYAXIA MIS 6.5X40MM (Screw) ×30 IMPLANT
SHEATH PAT (SHEATH) ×5 IMPLANT
SLEEVE SURGEON STRL (DRAPES) ×5 IMPLANT
SPACER 18X45X10-0 (Spacer) ×5 IMPLANT
SPACER 18X45X8-0 (Spacer) ×5 IMPLANT
SPONGE GAUZE 4X4 12PLY (GAUZE/BANDAGES/DRESSINGS) ×5 IMPLANT
SPONGE LAP 4X18 X RAY DECT (DISPOSABLE) ×5 IMPLANT
SPONGE SURGIFOAM ABS GEL 100 (HEMOSTASIS) ×5 IMPLANT
SPONGE SURGIFOAM ABS GEL SZ50 (HEMOSTASIS) IMPLANT
STRIP CLOSURE SKIN 1/2X4 (GAUZE/BANDAGES/DRESSINGS) ×12 IMPLANT
SUT VIC AB 0 CT1 18XCR BRD8 (SUTURE) ×9 IMPLANT
SUT VIC AB 0 CT1 8-18 (SUTURE) ×6
SUT VIC AB 2-0 OS6 18 (SUTURE) ×50 IMPLANT
SUT VIC AB 3-0 CP2 18 (SUTURE) ×25 IMPLANT
SYR 20ML ECCENTRIC (SYRINGE) ×5 IMPLANT
TAPE CLOTH 3X10 TAN LF (GAUZE/BANDAGES/DRESSINGS) ×10 IMPLANT
TAPE CLOTH SURG 4X10 WHT LF (GAUZE/BANDAGES/DRESSINGS) ×5 IMPLANT
TOP CLSR SEQUOIA (Orthopedic Implant) ×40 IMPLANT
TOWEL OR 17X24 6PK STRL BLUE (TOWEL DISPOSABLE) ×5 IMPLANT
TOWEL OR 17X26 10 PK STRL BLUE (TOWEL DISPOSABLE) ×5 IMPLANT
TRAP SPECIMEN MUCOUS 40CC (MISCELLANEOUS) ×5 IMPLANT
TRAY FOLEY CATH 14FRSI W/METER (CATHETERS) ×5 IMPLANT
WATER STERILE IRR 1000ML POUR (IV SOLUTION) ×5 IMPLANT

## 2014-02-25 NOTE — Transfer of Care (Signed)
Immediate Anesthesia Transfer of Care Note  Patient: Alexis King  Procedure(s) Performed: Procedure(s) with comments: ANTERIOR LATERAL LUMBAR FUSION 3 LEVELS (N/A) - Lumbar One-Three Lateral Interbody LUMBAR PERCUTANEOUS PEDICLE SCREW LUMBAR TWO-THREE,LUMBAR THREE-FOUR,LUMBAR FOUR-FIVE TRANSFORAMINAL LUMBAR INTERBODY FUSION (TLIF) WITH PEDICLE SCREW FIXATION 1 LEVEL - Transforaminal Lumbar Four-Five Interbody  Patient Location: PACU  Anesthesia Type:General  Level of Consciousness: sedated and responds to stimulation  Airway & Oxygen Therapy: Patient Spontanous Breathing and Patient connected to face mask oxygen  Post-op Assessment: Report given to PACU RN and Post -op Vital signs reviewed and stable  Post vital signs: Reviewed and stable  Complications: No apparent anesthesia complications

## 2014-02-25 NOTE — H&P (Signed)
Alexis King is an 68 y.o. female.   Chief Complaint: Back and leg pain HPI: The patient is a 67 year old female who was originally seen by Dr. Luiz Ochoa back in November of 2013. Attention was then impacted into the legs. She had an MRI scan at that time which showed disease at L2-3 L3-4 and L4-5. She was tried on conservative therapy but did not return for followup as she does somewhat better. 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 Howie Ill we located. At that time she was complaining of persistent back pain as well as neck issues. She had no new studies and it was elected to get MRI scans of her cervical and lumbar spine because of issues in both areas. The cervical skin fairly unremarkable. The lumbar scan however showed significant disc disease with listhesis at L2-3 L3-4 and L4-5. The situation was discussed in depth with the patient and it was felt that surgery was indicated he would need to be a three-level fusion. We discussed the benefits and risks associated with each approach. We felt that because much of her pain was in the back that an excellent choice would be to do a lateral fusion with percutaneous pedicle screws and cables indirect decompression and stabilization. She agreed with the plan is to proceed with surgery and now comes for a three-level lateral fusion with instrumentation. I've had a long discussion with her regarding the risks and benefits of surgical intervention. The risks discussed include but are not limited to bleeding infection weakness numbness paralysis trouble with instrumentation issues involving going through this so as muscle tone instrumentation nonunion coma and death. We have discussed alternative methods of therapy offered risks and benefits of nonintervention. She's had the opportunity to ask numerous questions and appears to understand. With this information in hand she has requested that we proceed with surgery.  Past Medical History   Diagnosis Date  . Hypertension   . Hyperlipidemia   . Depression   . Diabetes mellitus   . IBS (irritable bowel syndrome)   . Basal cell carcinoma of skin   . Coronary artery disease   . Dysrhythmia   . Pneumonia     early this year  . H/O hiatal hernia     otc  . GERD (gastroesophageal reflux disease)   . Arthritis   . Hypothyroidism     Past Surgical History  Procedure Laterality Date  . Cholecystectomy    . Cesarean section    . Lacrimal duct probing    . Basal cell carcinoma excision    . Thyroidectomy, partial    . Dilation and curettage of uterus    . Dental surgery      will have dental surgery 02/24/14, Root canal  . Root canal  02-24-14    Family History  Problem Relation Age of Onset  . Heart disease Father   . Arthritis Mother   . Pulmonary fibrosis Mother   . Skin cancer Mother   . Skin cancer Father    Social History:  reports that she has never smoked. She has never used smokeless tobacco. She reports that she drinks alcohol. She reports that she does not use illicit drugs.  Allergies:  Allergies  Allergen Reactions  . Ace Inhibitors Cough  . Clindamycin/Lincomycin     Severe stomach cramps  . Levaquin [Levofloxacin In D5w] Other (See Comments)    Achillis tendon pain  . Meloxicam Nausea Only    Stomach Cramps  .  Sulfa Antibiotics Rash    Medications Prior to Admission  Medication Dose Route Frequency Provider Last Rate Last Dose  . albuterol (PROVENTIL) (2.5 MG/3ML) 0.083% nebulizer solution 5 mg  5 mg Nebulization Once Ellison Carwin, MD      . ipratropium (ATROVENT) nebulizer solution 0.5 mg  0.5 mg Nebulization Once Ellison Carwin, MD       Medications Prior to Admission  Medication Sig Dispense Refill  . amLODipine (NORVASC) 10 MG tablet Take 5 mg by mouth daily.      Marland Kitchen aspirin EC 81 MG tablet Take 81 mg by mouth daily.      Marland Kitchen atorvastatin (LIPITOR) 40 MG tablet Take 40 mg by mouth at bedtime.      . Biotin 10 MG TABS Take 10 mg by  mouth daily.      Marland Kitchen buPROPion (WELLBUTRIN XL) 300 MG 24 hr tablet Take 1 tablet by mouth  daily (patient needs office for additional refills)  90 tablet  0  . Calcium-Magnesium-Vitamin D (CALCIUM MAGNESIUM PO) Take 1 capsule by mouth daily.      . carvedilol (COREG) 6.25 MG tablet Take 6.25 mg by mouth 2 (two) times daily with a meal.      . cholecalciferol (VITAMIN D) 1000 UNITS tablet Take 1,000 Units by mouth daily.       . clonazePAM (KLONOPIN) 0.5 MG tablet Take 0.5 mg by mouth at bedtime as needed (sleep).      . fluticasone (FLONASE) 50 MCG/ACT nasal spray Place 2 sprays into both nostrils at bedtime.      Marland Kitchen HYDROcodone-acetaminophen (NORCO/VICODIN) 5-325 MG per tablet Take 1 tablet by mouth every 6 (six) hours as needed for moderate pain.      Javier Docker Oil 500 MG CAPS Take 500 mg by mouth daily.       Marland Kitchen levothyroxine (SYNTHROID, LEVOTHROID) 150 MCG tablet Take 1 tablet (150 mcg total) by mouth daily.  30 tablet  0  . losartan-hydrochlorothiazide (HYZAAR) 100-12.5 MG per tablet Take 1 tablet by mouth daily.      . metFORMIN (GLUCOPHAGE-XR) 500 MG 24 hr tablet Take 1 tablet (500 mg total) by mouth 2 (two) times daily.  180 tablet  0  . minoxidil (ROGAINE) 2 % external solution Apply 1 application topically at bedtime.       . Multiple Vitamins-Minerals (MULTIVITAMIN WITH MINERALS) tablet Take 1 tablet by mouth daily.      . naproxen sodium (ANAPROX) 220 MG tablet Take 220-440 mg by mouth 2 (two) times daily with a meal. 220mg  in the morning and 440 at bedtime.      Vladimir Faster Glycol-Propyl Glycol (SYSTANE OP) Place 1 drop into both eyes daily as needed (dry eyes).       . sertraline (ZOLOFT) 100 MG tablet Take 100 mg by mouth daily.      Marland Kitchen spironolactone (ALDACTONE) 25 MG tablet Take 25 mg by mouth daily.      . vitamin C (ASCORBIC ACID) 500 MG tablet Take 500 mg by mouth daily.        Results for orders placed during the hospital encounter of 02/25/14 (from the past 48 hour(s))  GLUCOSE,  CAPILLARY     Status: Abnormal   Collection Time    02/25/14  8:26 AM      Result Value Ref Range   Glucose-Capillary 179 (*) 70 - 99 mg/dL   No results found.  Positive diabetes high blood pressure  Blood pressure 150/58, pulse 71,  temperature 98.9 F (37.2 C), temperature source Oral, resp. rate 18, height 5' 1.75" (1.568 m), SpO2 96.00%.  The patient is awake or and oriented. Her gait is mildly antalgic. Reflexes are decreased but equal. He is normal strength and sensation Assessment/Plan Impression is that of back and leg pain with significant disease at L2-3 L3-4 and L4-5. The plan is for a three-level lateral fusion with pedicle screw instrumentation.  Faythe Ghee, MD 02/25/2014, 9:28 AM

## 2014-02-25 NOTE — Anesthesia Postprocedure Evaluation (Signed)
  Anesthesia Post-op Note  Patient: Alexis King  Procedure(s) Performed: Procedure(s) with comments: ANTERIOR LATERAL LUMBAR FUSION 3 LEVELS (N/A) - Lumbar One-Three Lateral Interbody LUMBAR PERCUTANEOUS PEDICLE SCREW LUMBAR TWO-THREE,LUMBAR THREE-FOUR,LUMBAR FOUR-FIVE TRANSFORAMINAL LUMBAR INTERBODY FUSION (TLIF) WITH PEDICLE SCREW FIXATION 1 LEVEL - Transforaminal Lumbar Four-Five Interbody  Patient Location: PACU  Anesthesia Type:General  Level of Consciousness: awake, alert , oriented and patient cooperative  Airway and Oxygen Therapy: Patient Spontanous Breathing and Patient connected to nasal cannula oxygen  Post-op Pain: mild  Post-op Assessment: Post-op Vital signs reviewed, Patient's Cardiovascular Status Stable, Respiratory Function Stable, Patent Airway, No signs of Nausea or vomiting and Pain level controlled  Post-op Vital Signs: Reviewed and stable  Last Vitals:  Filed Vitals:   02/25/14 2030  BP:   Pulse: 70  Temp:   Resp: 20    Complications: No apparent anesthesia complications

## 2014-02-25 NOTE — Anesthesia Preprocedure Evaluation (Addendum)
Anesthesia Evaluation  Patient identified by MRN, date of birth, ID band Patient awake    Reviewed: Allergy & Precautions, H&P , NPO status , Patient's Chart, lab work & pertinent test results, reviewed documented beta blocker date and time   History of Anesthesia Complications Negative for: history of anesthetic complications  Airway Mallampati: II     Comment: Dental surgery yesterday as per patient. Discussed with patient in detail of securing away and possible dental injury. Patient aware and understood. [facility]   Pulmonary pneumonia -, resolved,          Cardiovascular hypertension, Pt. on home beta blockers + CAD + dysrhythmias     Neuro/Psych PSYCHIATRIC DISORDERS Depression    GI/Hepatic hiatal hernia, GERD-  Medicated and Controlled,  Endo/Other  diabetes, Well Controlled, Type 2, Oral Hypoglycemic AgentsHypothyroidism   Renal/GU      Musculoskeletal negative musculoskeletal ROS (+)   Abdominal   Peds  Hematology   Anesthesia Other Findings   Reproductive/Obstetrics negative OB ROS                       Anesthesia Physical Anesthesia Plan  ASA: III  Anesthesia Plan: General   Post-op Pain Management:    Induction: Intravenous  Airway Management Planned: Oral ETT  Additional Equipment:   Intra-op Plan:   Post-operative Plan: Extubation in OR  Informed Consent: I have reviewed the patients History and Physical, chart, labs and discussed the procedure including the risks, benefits and alternatives for the proposed anesthesia with the patient or authorized representative who has indicated his/her understanding and acceptance.   Dental advisory given  Plan Discussed with: CRNA, Anesthesiologist and Surgeon  Anesthesia Plan Comments:         Anesthesia Quick Evaluation

## 2014-02-25 NOTE — Op Note (Signed)
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 The right flank was then prepped and draped in usual sterile fashion. Linear incision was made along the L3-4 interspace and carried down to the muscle. We then made a second incision posterior to access into the retroperitoneum which was done without difficulty. We then turned our finger upward allowing access into the retroperitoneum from the more flank incision. We then did sequential dilation through the sunrise muscle using monitoring and no abnormal readings were encountered. We placed a retractor in standard fashion opened up slightly did testing show no abnormal readings and then impacted the shim into the disc space. We opened up the retractor further and then cleaned off any residual so as muscle off of the disc space. We incised the disc space at L3-4 with a 15 blade and thoroughly cleaned out with a variety of instruments. We used a Cobb elevator to release the annulus on the opposite side. We did sequential distraction with a variety of distractors. When we did a 10 mm parallel distractor we felt that this was a good choice and we chose a 10 x 18 x 45 mm graft and filled it with morselized allograft. We then impacted the cage and the disc space without difficulty and followed in good position. We irrigated copiously removed the  retractor and fossae showed good position of the graft in AP lateral direction. We irrigated the wound and turned our attention to L4-5. We did a similar approach to the disc space. We had trouble getting the shim into the disc eventually able to do it. The retractor was anteriorly placed retractor were posterior to its without difficulty. We then tried to reposition the retractor posteriorly we encountered some abnormally low readings and then saw significant neural branch which we felt every possibility a significant branch of the femoral nerve. After discussing the options we chose to not fuse this level laterally. We irrigated removed the retractor and turned our attention to L2-3 we were able to get good access to the retroperitoneum did sequential dilation through the psoas muscle without and with no abnormal readings and placed a retractor without difficulty. We opened up the retractor secured to the disc space with the shim. We then incised the disc thoroughly cleaned out with a variety of instruments and release the annulus on the opposite side. We distracted this level up to an 8 mm size which we felt was a good choice. We chose a parallel 8 x 18 x 45 mm graft and filled with a mixture of autologous morselized allograft. After irrigating copiously with impacted the cage into good position which was confirmed in AP lateral fluoroscopy. We irrigated once more and move the retractor. Final fossae showed good lateral fusion grafts at L2-3 L3-4. Irrigated these incisions closed the mall with interrupted Vicryl along with Dermabond and Steri-Strips on the skin. We then turned the patient prone identify the L4-5 level. We made an incision above the spinous processes of L4 and L5. With subperiosteal along  the spinous processes lamina facet joint placed a self-retaining retractor for exposure. X-ray showed approach the appropriate level. Using the high-speed drill removing the inferior one half of the L4 lamina the  medial three quarters of the facet joint the superior one half of the L5 lamina. Residual bone and ligamentum flavum removed in a piecemeal fashion. We then identified the disc space incised and thoroughly cleaned out with a variety of instruments. We placed a mixture of autologous bone morselized allograft it within the interspace and then distracted the interspace up to an 8 mm size which we felt was a good choice. We then placed an 8 x 11 x 30 mm graft and the disc space at difficulty and kicked it into a transverse position. Final fluoroscopy looked excellent. We irrigated copiously and controlled any bleeding with upper coagulation Gelfoam. We closed the wound in multiple layers of Vicryl on the fascia subcutaneous and subcuticular tissues. We then placed percutaneous pedicle screws at L2 L3-L4 and L5 bilaterally. We passed Jamshidi needles from lateral to medial direction to the pedicles without difficulty. We then placed a guidewire through the Jamshidi needles and incised the fascia. The guidewires relatively close together went up making one incision on each side. We then tapped with a 6.0 mm tap and placed 6.5 x 40 mm screws and removed the wires. We chose an appropriately length rods passed them down the [facility] and secured them to the top the top loading nuts which reduce the rods in excellent position. We then did tightening and final tightening with torque and counter torque and then removed the [place]. Final fluoroscopy in AP lateral direction looked excellent. We irrigated both wounds copiously and closed the fashion with 0 Vicryl. We then did multiple layers of Vicryl on the subcutaneous and subcuticular tissues and Dermabond and Steri-Strips on the skin. Sterile dressings were then applied the patient was extubated and taken to recovery room in stable condition.

## 2014-02-25 NOTE — Progress Notes (Signed)
ANTIBIOTIC CONSULT NOTE - INITIAL  Pharmacy Consult for Vancomycin  Indication: Surgical Prophylaxis  Allergies  Allergen Reactions  . Ace Inhibitors Cough  . Clindamycin/Lincomycin     Severe stomach cramps  . Levaquin [Levofloxacin In D5w] Other (See Comments)    Achillis tendon pain  . Meloxicam Nausea Only    Stomach Cramps  . Sulfa Antibiotics Rash    Patient Measurements: Height: 5' 1.75" (156.8 cm) IBW/kg (Calculated) : 49.53  Vital Signs: Temp: 98.1 F (36.7 C) (07/09 2222) Temp src: Oral (07/09 2222) BP: 154/70 mmHg (07/09 2222) Pulse Rate: 68 (07/09 2204)   Medical History: Past Medical History  Diagnosis Date  . Hypertension   . Hyperlipidemia   . Depression   . Diabetes mellitus   . IBS (irritable bowel syndrome)   . Basal cell carcinoma of skin   . Coronary artery disease   . Dysrhythmia   . Pneumonia     early this year  . H/O hiatal hernia     otc  . GERD (gastroesophageal reflux disease)   . Arthritis   . Hypothyroidism     Assessment: 69 y/o F s/p fusion/laminotomy. No drains noted to be in place. Afebrile. WBC WNL.   Goal of Therapy:  Infection prevention   Plan:  Vancomycin 1000 mg IV x 1 post-op  Narda Bonds 02/25/2014,10:55 PM

## 2014-02-25 NOTE — OR Nursing (Signed)
Gold colored band cut from left hand, fourth finger due to swelling. In bag labeled c pt sticker, to PACU c pt.

## 2014-02-25 NOTE — Progress Notes (Signed)
Pt states that  She had 2 root canals done yesterday.

## 2014-02-25 NOTE — Progress Notes (Signed)
Dr Hal Neer called by Sharyn Lull to clarify consent.  New orders noted.

## 2014-02-25 NOTE — OR Nursing (Signed)
Patient has ring on left ring finger.Several attempts were made in short stay to get ring off.The only recourse was to cut the ring. Patient did not want ring to be cut.Explained that due to the length of the procedure and positioning of the procedure there would be swelling of her fingers.She states she understands but did not want the ring cut.  Explained that we as the staff would watch the swelling during  surgery and if her circulation becomes compramised the ring would be cut.Patient states she understands. Angelique Chevalier RN

## 2014-02-26 ENCOUNTER — Encounter: Payer: Self-pay | Admitting: Internal Medicine

## 2014-02-26 LAB — GLUCOSE, CAPILLARY
GLUCOSE-CAPILLARY: 152 mg/dL — AB (ref 70–99)
Glucose-Capillary: 230 mg/dL — ABNORMAL HIGH (ref 70–99)
Glucose-Capillary: 141 mg/dL — ABNORMAL HIGH (ref 70–99)
Glucose-Capillary: 152 mg/dL — ABNORMAL HIGH (ref 70–99)

## 2014-02-26 MED ORDER — HYDROMORPHONE HCL 2 MG PO TABS
4.0000 mg | ORAL_TABLET | ORAL | Status: DC | PRN
  Administered 2014-02-26 – 2014-03-04 (×16): 4 mg via ORAL
  Filled 2014-02-26 (×17): qty 2

## 2014-02-26 MED ORDER — HYDROMORPHONE HCL PF 1 MG/ML IJ SOLN
1.0000 mg | INTRAMUSCULAR | Status: DC | PRN
  Administered 2014-02-26 – 2014-02-28 (×3): 1 mg via INTRAMUSCULAR
  Filled 2014-02-26 (×3): qty 1

## 2014-02-26 MED ORDER — HYDROMORPHONE HCL 2 MG PO TABS: 4.0000 mg | ORAL_TABLET | ORAL | Status: DC | PRN

## 2014-02-26 MED ORDER — PANTOPRAZOLE SODIUM 40 MG PO TBEC
40.0000 mg | DELAYED_RELEASE_TABLET | Freq: Every day | ORAL | Status: DC
Start: 2014-02-26 — End: 2014-03-04
  Administered 2014-02-26 – 2014-03-03 (×6): 40 mg via ORAL
  Filled 2014-02-26 (×5): qty 1

## 2014-02-26 NOTE — Progress Notes (Signed)
Paged MD regarding pt requesting a change in pain medication at 1015. Dr. Hal Neer rounded at 1036 and spoke with him. New orders.

## 2014-02-26 NOTE — Progress Notes (Signed)
Utilization review completed.  

## 2014-02-26 NOTE — Progress Notes (Signed)
Patient log rolled Lumbar corset placed, Patient sat on side of bed and ambulated in room approx. 4 ft. Patient tolaerated fair.

## 2014-02-26 NOTE — Evaluation (Signed)
Physical Therapy Evaluation Patient Details Name: Alexis King MRN: [id number] DOB: 1945-10-25 Today's Date: 02/26/2014   History of Present Illness  Pt admitted for lumbar fusion with ALIF L1-3 and TLIF L3-5. pt with root canal 02/24/14  Clinical Impression  Pt pleasant but initially appearing anxious with mobility with wires tangled around her in bed and required education for lines and precautions. Pt educated with handout for precautions for bed mobility, brace wear, transfers, gait, activity modification and some ADLs with pt setup to brush teeth end of session. Pt with decreased RLE function currently unclear if only pain related or true weakness, will continue to assess. Pt is caregiver for spouse and hopeful she will progress quickly to meet goals for return home. Pt will benefit from acute therapy to maximize mobility, gait, function and safety to decrease burden of care.     Follow Up Recommendations Home health PT;Supervision for mobility/OOB    Equipment Recommendations  Rolling walker with 5" wheels;3in1 (PT)    Recommendations for Other Services       Precautions / Restrictions Precautions Precautions: Back;Fall Precaution Booklet Issued: Yes (comment) Required Braces or Orthoses: Spinal Brace Spinal Brace: Lumbar corset;Applied in sitting position      Mobility  Bed Mobility Overal bed mobility: Needs Assistance Bed Mobility: Rolling;Sidelying to Sit Rolling: Mod assist Sidelying to sit: Mod assist;+2 for safety/equipment       General bed mobility comments: cues for sequence with assist to pivot hips and elevate trunk from surface  Transfers Overall transfer level: Needs assistance   Transfers: Sit to/from Stand Sit to Stand: Min assist         General transfer comment: cues for hand placement and safety  Ambulation/Gait Ambulation/Gait assistance: Min assist Ambulation Distance (Feet): 15 Feet Assistive device: Rolling walker (2 wheeled) Gait  Pattern/deviations: Step-to pattern;Decreased step length - right;Wide base of support   Gait velocity interpretation: <1.8 ft/sec, indicative of risk for recurrent falls General Gait Details: pt initially only turning RLE to advance foot but with increased distance able to clear RLE although limited with gait. Cues throughout for posture and position in RW  Stairs            Wheelchair Mobility    Modified Rankin (Stroke Patients Only)       Balance Overall balance assessment: Needs assistance   Sitting balance-Leahy Scale: Fair       Standing balance-Leahy Scale: Fair                               Pertinent Vitals/Pain 8/10 back pain, premedicated, repositioned sats 94% on Ra Hr 115    Home Living Family/patient expects to be discharged to:: Private residence Living Arrangements: Spouse/significant other Available Help at Discharge: Family;Available PRN/intermittently Type of Home: House Home Access: Stairs to enter   Entrance Stairs-Number of Steps: 4 Home Layout: Two level;Able to live on main level with bedroom/bathroom;Laundry or work area in [place]: Environmental consultant - standard      Prior Function Level of Independence: Independent         Comments: 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      Hand Dominance        Extremity/Trunk Assessment   Upper Extremity Assessment: Generalized weakness           Lower Extremity Assessment: RLE deficits/detail;LLE deficits/detail RLE Deficits / Details: pt unable to complete heel  slide in sitting  or hip flexion in seated with strength of hip flexion and hamstring 2/5, quads at least 3/5 did not attempt resistance due to back pain, DF at 5/5 LLE Deficits / Details: [facility] for all activities  Cervical / Trunk Assessment: Normal  Communication   Communication: No difficulties  Cognition Arousal/Alertness: Awake/alert Behavior During Therapy: WFL for tasks  assessed/performed Overall Cognitive Status: Within Functional Limits for tasks assessed       Memory: Decreased recall of precautions              General Comments      Exercises        Assessment/Plan    PT Assessment Patient needs continued PT services  PT Diagnosis Difficulty walking;Generalized weakness;Acute pain   PT Problem List Decreased strength;Decreased activity tolerance;Decreased balance;Decreased knowledge of use of DME;Pain;Decreased knowledge of precautions;Decreased mobility  PT Treatment Interventions DME instruction;Gait training;Stair training;Functional mobility training;Therapeutic activities;Therapeutic exercise;Patient/family education   PT Goals (Current goals can be found in the Care Plan section) Acute Rehab PT Goals Patient Stated Goal: be able to get back to walking and caring for spouse PT Goal Formulation: With patient Time For Goal Achievement: 03/12/14 Potential to Achieve Goals: Fair    Frequency Min 5X/week   Barriers to discharge Decreased caregiver support      Co-evaluation               End of Session Equipment Utilized During Treatment: Back brace;Gait belt Activity Tolerance: Patient limited by pain Patient left: in chair;with call bell/phone within reach Nurse Communication: Mobility status;Precautions         Time: 2263-3354 PT Time Calculation (min): 27 min   Charges:   PT Evaluation $Initial PT Evaluation Tier I: 1 Procedure PT Treatments $Gait Training: 8-22 mins $Therapeutic Activity: 8-22 mins   PT G Codes:          Melford Aase 02/26/2014, 11:36 AM Elwyn Reach, [place]

## 2014-02-26 NOTE — Progress Notes (Signed)
OT Cancellation Note  Patient Details Name: ZAYNEB BAUCUM MRN: [id number] DOB: 08-14-1946   Cancelled Treatment:    Reason Eval/Treat Not Completed: pt eating lunch.  Will reattempt.  Darlina Rumpf [place], OTR/L [phone]  02/26/2014, 1:13 PM

## 2014-02-26 NOTE — Progress Notes (Signed)
Patient ID: Alexis King, female   DOB: [birth year], 68 y.o.   MRN: [id number] Afeb, vss Some hesitancy to use right thigh muscles, but is now ambulating while I am here and she can walk and support her own weight on the right leg so hopefully just pain from the trans psoas approach rather than true nerve weakness. Wounds all look fine. Will take things a day at a time, and assess her progress over time. She does appear to be about wher I would expect her to be pod 1 after such a big multi step procedure.

## 2014-02-26 NOTE — Evaluation (Signed)
Occupational Therapy Evaluation Patient Details Name: Alexis King MRN: [id number] DOB: February 20, 1946 Today's Date: 02/26/2014    History of Present Illness Pt admitted for lumbar fusion with ALIF L1-3 and TLIF L3-5. pt with root canal 02/24/14   Clinical Impression   Pt admitted with above. She demonstrates the below listed deficits and will benefit from continued OT to maximize safety and independence with BADLs.  Pt did well POD 1.  Anticipate good progress.  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  Recommend HHOT and 3in1      Follow Up Recommendations  Home health OT;Supervision/Assistance - 24 hour    Equipment Recommendations  3 in 1 bedside comode    Recommendations for Other Services       Precautions / Restrictions Precautions Precautions: Back;Fall Precaution Booklet Issued: Yes (comment) Precaution Comments: Pt able to state 3/3 back precautions  Required Braces or Orthoses: Spinal Brace Spinal Brace: Lumbar corset;Applied in sitting position      Mobility Bed Mobility Overal bed mobility: Needs Assistance Bed Mobility: Rolling;Sidelying to Sit;Sit to Sidelying Rolling: Min assist Sidelying to sit: Min assist     Sit to sidelying: Mod assist General bed mobility comments: Pt requires step by step cues, increased time.  Assist to lift LEs onto bed  Transfers Overall transfer level: Needs assistance Equipment used: Rolling walker (2 wheeled) Transfers: Sit to/from Stand Sit to Stand: Min assist         General transfer comment: cues for hand placement and safety    Balance Overall balance assessment: Needs assistance Sitting-balance support: Feet supported Sitting balance-Leahy Scale: Fair     Standing balance support: Bilateral upper extremity supported Standing balance-Leahy Scale: Fair                              ADL Overall ADL's : Needs assistance/impaired Eating/Feeding:  Independent   Grooming: Wash/dry hands;Wash/dry face;Oral care;Brushing hair;Sitting;Set up;Supervision/safety   Upper Body Bathing: Minimal assitance;Sitting   Lower Body Bathing: Maximal assistance;Sit to/from stand   Upper Body Dressing : Minimal assistance;Sitting   Lower Body Dressing: Total assistance;Sit to/from stand   Toilet Transfer: Minimal assistance;Stand-pivot;BSC   Toileting- Clothing Manipulation and Hygiene: Moderate assistance;Sit to/from stand       Functional mobility during ADLs: Minimal assistance;Rolling walker (pivot transfers) General ADL Comments: Pt unable to access feet by crossing ankles over knees.  Discussed need for AE with pt and discussed use of tongs for toileting as she likely will be unable to access peri area     Vision                     Perception     Praxis      Pertinent Vitals/Pain See vitals flow sheet     Hand Dominance     Extremity/Trunk Assessment Upper Extremity Assessment Upper Extremity Assessment: Generalized weakness   Lower Extremity Assessment Lower Extremity Assessment: Defer to PT evaluation   Cervical / Trunk Assessment Cervical / Trunk Assessment: Normal   Communication Communication Communication: No difficulties   Cognition Arousal/Alertness: Awake/alert Behavior During Therapy: WFL for tasks assessed/performed Overall Cognitive Status: Within Functional Limits for tasks assessed                     General Comments       Exercises       Shoulder Instructions  Home Living Family/patient expects to be discharged to:: Private residence Living Arrangements: Spouse/significant other Available Help at Discharge: Family;Available PRN/intermittently Type of Home: House Home Access: Stairs to enter Entrance Stairs-Number of Steps: 4   Home Layout: Two level;Able to live on main level with bedroom/bathroom;Laundry or work area in basement     [employer]: Emergency planning/management officer: Handicapped height Bathroom Accessibility: Yes How Accessible: Accessible via [place]: Environmental consultant - standard          Prior Functioning/Environment Level of Independence: Independent        Comments: 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     OT Diagnosis: Generalized weakness;Acute pain   OT Problem List: Decreased strength;Decreased activity tolerance;Impaired balance (sitting and/or standing);Decreased knowledge of use of DME or AE;Decreased knowledge of precautions;Obesity;Pain   OT Treatment/Interventions: Self-care/ADL training;DME and/or AE instruction;Therapeutic activities;Patient/family education;Balance training    OT Goals(Current goals can be found in the care plan section) Acute Rehab OT Goals Patient Stated Goal: be able to get back to walking and caring for spouse OT Goal Formulation: With patient Time For Goal Achievement: 03/05/14 Potential to Achieve Goals: Good ADL Goals Pt Will Perform Grooming: with supervision;standing Pt Will Perform Upper Body Bathing: with supervision;sitting;with set-up Pt Will Perform Lower Body Bathing: with supervision;with adaptive equipment;sit to/from stand Pt Will Perform Upper Body Dressing: with supervision;sitting Pt Will Perform Lower Body Dressing: with supervision;with adaptive equipment;sit to/from stand Pt Will Transfer to Toilet: with supervision;ambulating;regular height toilet;bedside commode;grab bars Pt Will Perform Toileting - Clothing Manipulation and hygiene: with supervision;sit to/from stand;with adaptive equipment Pt Will Perform Tub/Shower Transfer: Shower transfer;with supervision;ambulating;shower seat;rolling walker Additional ADL Goal #1: Pt will be independent with back precautions during all BADLs and functional mobility   OT Frequency: Min 3X/week   Barriers to D/C: Decreased caregiver support           Co-evaluation              End of Session Equipment Utilized During Treatment: Rolling walker;Back brace Nurse Communication: Mobility status  Activity Tolerance: Patient tolerated treatment well Patient left: in bed;with call bell/phone within reach;with bed alarm set   Time: 0981-1914 OT Time Calculation (min): 41 min Charges:  OT General Charges $OT Visit: 1 Procedure OT Evaluation $Initial OT Evaluation Tier I: 1 Procedure OT Treatments $Self Care/Home Management : 8-22 mins $Therapeutic Activity: 8-22 mins G-Codes:    Shana Younge M 2014-03-21, 3:37 PM

## 2014-02-27 LAB — GLUCOSE, CAPILLARY
GLUCOSE-CAPILLARY: 162 mg/dL — AB (ref 70–99)
GLUCOSE-CAPILLARY: 140 mg/dL — AB (ref 70–99)
GLUCOSE-CAPILLARY: 155 mg/dL — AB (ref 70–99)

## 2014-02-27 MED ORDER — OXYCODONE-ACETAMINOPHEN 5-325 MG PO TABS
1.0000 | ORAL_TABLET | ORAL | Status: DC | PRN
  Administered 2014-03-02: 1 via ORAL
  Administered 2014-03-04: 2 via ORAL
  Filled 2014-02-27: qty 1
  Filled 2014-02-27 (×2): qty 2

## 2014-02-27 MED ORDER — INSULIN ASPART 100 UNIT/ML ~~LOC~~ SOLN
0.0000 [IU] | Freq: Three times a day (TID) | SUBCUTANEOUS | Status: DC
Start: 2014-02-27 — End: 2014-03-04
  Administered 2014-02-27: 3 [IU] via SUBCUTANEOUS
  Administered 2014-02-27: 2 [IU] via SUBCUTANEOUS
  Administered 2014-02-27 – 2014-02-28 (×2): 3 [IU] via SUBCUTANEOUS
  Administered 2014-02-28 – 2014-03-02 (×4): 2 [IU] via SUBCUTANEOUS
  Administered 2014-03-02: 3 [IU] via SUBCUTANEOUS
  Administered 2014-03-04: 2 [IU] via SUBCUTANEOUS
  Administered 2014-03-04: 5 [IU] via SUBCUTANEOUS

## 2014-02-27 MED ORDER — INSULIN ASPART 100 UNIT/ML ~~LOC~~ SOLN
4.0000 [IU] | Freq: Three times a day (TID) | SUBCUTANEOUS | Status: DC
  Administered 2014-02-27 – 2014-03-04 (×12): 4 [IU] via SUBCUTANEOUS

## 2014-02-27 MED ORDER — MORPHINE SULFATE 4 MG/ML IJ SOLN
4.0000 mg | INTRAMUSCULAR | Status: DC | PRN
  Administered 2014-02-27 – 2014-03-01 (×8): 4 mg via INTRAMUSCULAR
  Filled 2014-02-27 (×8): qty 1

## 2014-02-27 NOTE — Progress Notes (Signed)
OT Cancellation Note  Patient Details Name: Alexis King MRN: [id number] DOB: [birth year]   Cancelled Treatment:    Reason Eval/Treat Not Completed: Fatigue/lethargy limiting ability to participate. Pt given pain meds, now sleeping. Family requesting pt not be disturbed.  Will continue to follow.  Malka So 02/27/2014, 3:41 PM

## 2014-02-27 NOTE — Progress Notes (Signed)
ATTENDING: Please call Amy, daughter with an update tomorrow 7/11. Patient has some memory impairment and confusion d/t pain meds today. Thanks!

## 2014-02-27 NOTE — Progress Notes (Signed)
Subjective: Patient sitting up in chair at bedside. Has been having a lot of pain, nursing staff has been using Dilaudid by mouth or IM, but reports that it does cause her to be somewhat lethargic and less clearly responsive. Patient working with PT and OT, mobility limited, patient reports that she ambulated to the counter of the nursing death  Objective: Vital signs in last 24 hours: Filed Vitals:   02/26/14 2355 02/27/14 0353 02/27/14 0816 02/27/14 1006  BP: 145/64 149/56 127/64 128/55  Pulse: 91 85 83   Temp: 98.5 F (36.9 C) 98.5 F (36.9 C) 98.7 F (37.1 C)   TempSrc: Oral Oral Oral   Resp: 14 22 19 19   Height:      Weight:      SpO2: 96% 97% 95%     Intake/Output from previous day: 07/10 0701 - 07/11 0700 In: 1725 [I.V.:1725] Out: 2900 [Urine:2900] Intake/Output this shift: Total I/O In: 437.5 [P.O.:240; I.V.:197.5] Out: 775 [Urine:775]  Physical Exam:  Moving all 4 extremities well.  Studies/Results: Dg Lumbar Spine 2-3 Views  02/25/2014   CLINICAL DATA:  Lumbar spine fusion portable images  EXAM: DG C-ARM GT 120 MIN; LUMBAR SPINE - 2-3 VIEW  COMPARISON:  Lumbar MRI, 01/22/2014  FINDINGS: Bilateral pedicle screws and interconnecting rods extend from L2 through L5. The orthopedic hardware is well-seated. Radiolucent disc spacers maintained disc height at L2-L3-L3-L4 and partly maintained disc height at L4-L5.  There is no evidence of an operative complication.  IMPRESSION: Lumbar spine fusion images as described.   Electronically Signed   By: Lajean Manes M.D.   On: 02/25/2014 18:30   Dg C-arm Gt 120 Min  02/25/2014   CLINICAL DATA:  Lumbar spine fusion portable images  EXAM: DG C-ARM GT 120 MIN; LUMBAR SPINE - 2-3 VIEW  COMPARISON:  Lumbar MRI, 01/22/2014  FINDINGS: Bilateral pedicle screws and interconnecting rods extend from L2 through L5. The orthopedic hardware is well-seated. Radiolucent disc spacers maintained disc height at L2-L3-L3-L4 and partly maintained disc  height at L4-L5.  There is no evidence of an operative complication.  IMPRESSION: Lumbar spine fusion images as described.   Electronically Signed   By: Lajean Manes M.D.   On: 02/25/2014 18:30    Assessment/Plan: Gradual progress following extensive surgery 2 days ago. Mobility very limited, therefore will leave [place] in for now. Continue PT and OT.   Hosie Spangle, MD 02/27/2014, 10:31 AM

## 2014-02-27 NOTE — Progress Notes (Signed)
Physical Therapy Treatment Patient Details Name: Alexis King MRN: [id number] DOB: 05-Jan-1946 Today's Date: 02/27/2014    History of Present Illness Pt admitted for lumbar fusion with ALIF L1-3 and TLIF L3-5. pt with root canal 02/24/14    PT Comments    Patient remains limited with mobility today. Attempted ambulation but could not tolerate increase in distance. Still requires increased assist for transfers and safety. Will continue to see and progress as tolerated. Concerned for patient progression as patient needs to be modified I for discharge. May need to consider change in POC if patient does not progress.   Follow Up Recommendations  Home health PT;Supervision for mobility/OOB     Equipment Recommendations  Rolling walker with 5" wheels;3in1 (PT)    Recommendations for Other Services       Precautions / Restrictions Precautions Precautions: Back;Fall Precaution Booklet Issued: Yes (comment) Precaution Comments: Pt able to state 3/3 back precautions  Required Braces or Orthoses: Spinal Brace Spinal Brace: Lumbar corset;Applied in sitting position    Mobility  Bed Mobility Overal bed mobility: Needs Assistance Bed Mobility: Rolling;Sidelying to Sit Rolling: Mod assist Sidelying to sit: Mod assist;+2 for safety/equipment       General bed mobility comments: increased time to perform, cues for sequence with assist to pivot hips and elevate trunk from surface  Transfers Overall transfer level: Needs assistance Equipment used: Rolling walker (2 wheeled) Transfers: Sit to/from Stand Sit to Stand: Min assist (from elevated bed surface)         General transfer comment: cues for hand placement and safety  Ambulation/Gait Ambulation/Gait assistance: Min assist Ambulation Distance (Feet): 12 Feet Assistive device: Rolling walker (2 wheeled) Gait Pattern/deviations: Step-to pattern;Decreased step length - right;Wide base of support Gait velocity:  decreased Gait velocity interpretation: <1.8 ft/sec, indicative of risk for recurrent falls General Gait Details: VCs for sequencing and weight shifting, difficutly with stride and clearance of RLE (remains painful and weak) cues for encouragement   Stairs            Wheelchair Mobility    Modified Rankin (Stroke Patients Only)       Balance   Sitting-balance support: Feet supported Sitting balance-Leahy Scale: Fair     Standing balance support: Bilateral upper extremity supported Standing balance-Leahy Scale: Fair                      Cognition Arousal/Alertness: Awake/alert Behavior During Therapy: WFL for tasks assessed/performed Overall Cognitive Status: Within Functional Limits for tasks assessed       Memory: Decreased recall of precautions (recalled 2/3 precautions)              Exercises      General Comments        Pertinent Vitals/Pain 6/10    Home Living Family/patient expects to be discharged to:: Private residence Living Arrangements: Spouse/significant other                  Prior Function            PT Goals (current goals can now be found in the care plan section) Acute Rehab PT Goals Patient Stated Goal: be able to get back to walking and caring for spouse PT Goal Formulation: With patient Time For Goal Achievement: 03/12/14 Potential to Achieve Goals: Fair Progress towards PT goals: Progressing toward goals (modestly)    Frequency  Min 5X/week    PT Plan Current plan remains appropriate    Co-evaluation  End of Session Equipment Utilized During Treatment: Back brace;Gait belt Activity Tolerance: Patient limited by pain Patient left: in chair;with call bell/phone within reach     Time: 0901-0926 PT Time Calculation (min): 25 min  Charges:  $Gait Training: 8-22 mins $Therapeutic Activity: 8-22 mins                    G CodesDuncan Dull 2014-03-24, 10:51 AM Alben Deeds, PT DPT  [phone]

## 2014-02-28 ENCOUNTER — Inpatient Hospital Stay (HOSPITAL_COMMUNITY): Payer: Medicare Other

## 2014-02-28 DIAGNOSIS — R34 Anuria and oliguria: Secondary | ICD-10-CM

## 2014-02-28 LAB — BASIC METABOLIC PANEL
Anion gap: 15 (ref 5–15)
BUN: 21 mg/dL (ref 6–23)
CHLORIDE: 88 meq/L — AB (ref 96–112)
CO2: 28 meq/L (ref 19–32)
CREATININE: 1.23 mg/dL — AB (ref 0.50–1.10)
Calcium: 8.3 mg/dL — ABNORMAL LOW (ref 8.4–10.5)
GFR calc Af Amer: 51 mL/min — ABNORMAL LOW (ref >90)
GFR calc non Af Amer: 44 mL/min — ABNORMAL LOW (ref >90)
Glucose, Bld: 105 mg/dL — ABNORMAL HIGH (ref 70–99)
Potassium: 4.3 mEq/L (ref 3.7–5.3)
Sodium: 131 mEq/L — ABNORMAL LOW (ref 137–147)

## 2014-02-28 LAB — CBC WITH DIFFERENTIAL/PLATELET
BASOS PCT: 0 % (ref 0–1)
Basophils Absolute: 0 10*3/uL (ref 0.0–0.1)
EOS ABS: 0.2 10*3/uL (ref 0.0–0.7)
EOS PCT: 1 % (ref 0–5)
HEMATOCRIT: 29.7 % — AB (ref 36.0–46.0)
Hemoglobin: 10.3 g/dL — ABNORMAL LOW (ref 12.0–15.0)
Lymphocytes Relative: 10 % — ABNORMAL LOW (ref 12–46)
Lymphs Abs: 1.4 10*3/uL (ref 0.7–4.0)
MCH: 32.4 pg (ref 26.0–34.0)
MCHC: 34.7 g/dL (ref 30.0–36.0)
MCV: 93.4 fL (ref 78.0–100.0)
MONO ABS: 1 10*3/uL (ref 0.1–1.0)
Monocytes Relative: 8 % (ref 3–12)
NEUTROS ABS: 10.8 10*3/uL — AB (ref 1.7–7.7)
Neutrophils Relative %: 81 % — ABNORMAL HIGH (ref 43–77)
Platelets: 206 10*3/uL (ref 150–400)
RBC: 3.18 MIL/uL — ABNORMAL LOW (ref 3.87–5.11)
RDW: 12.1 % (ref 11.5–15.5)
WBC: 13.4 10*3/uL — ABNORMAL HIGH (ref 4.0–10.5)

## 2014-02-28 LAB — CK: Total CK: 667 U/L — ABNORMAL HIGH (ref 7–177)

## 2014-02-28 LAB — HEPATIC FUNCTION PANEL
ALT: 25 U/L (ref 0–35)
AST: 29 U/L (ref 0–37)
Albumin: 2.7 g/dL — ABNORMAL LOW (ref 3.5–5.2)
Alkaline Phosphatase: 62 U/L (ref 39–117)
BILIRUBIN TOTAL: 0.6 mg/dL (ref 0.3–1.2)
Bilirubin, Direct: <0.2 mg/dL (ref 0.0–0.3)
Total Protein: 6.4 g/dL (ref 6.0–8.3)

## 2014-02-28 LAB — GLUCOSE, CAPILLARY
GLUCOSE-CAPILLARY: 118 mg/dL — AB (ref 70–99)
GLUCOSE-CAPILLARY: 101 mg/dL — AB (ref 70–99)
Glucose-Capillary: 125 mg/dL — ABNORMAL HIGH (ref 70–99)
Glucose-Capillary: 152 mg/dL — ABNORMAL HIGH (ref 70–99)

## 2014-02-28 LAB — PROCALCITONIN

## 2014-02-28 LAB — AMMONIA: Ammonia: 10 umol/L — ABNORMAL LOW (ref 11–60)

## 2014-02-28 LAB — TROPONIN I: Troponin I: <0.3 ng/mL (ref <0.30)

## 2014-02-28 LAB — LACTIC ACID, PLASMA: LACTIC ACID, VENOUS: 1 mmol/L (ref 0.5–2.2)

## 2014-02-28 MED ORDER — SODIUM CHLORIDE 0.9 % IV BOLUS (SEPSIS)
1000.0000 mL | Freq: Once | INTRAVENOUS | Status: AC
  Administered 2014-02-28: 1000 mL via INTRAVENOUS

## 2014-02-28 NOTE — Progress Notes (Signed)
Patient ID: Alexis King, female   DOB: 02/28/1946, 68 y.o.   MRN: [id number] Called as the patient secondary to hypotension and lethargy and possibly low urine output patient has been intermittently lethargic throughout [place] positions also shown intermittent confusion currently the patient is complaining of back pain side pain but she denies any new pain in her legs denies any new numbness and tingling or legs or feet.  Neurologic exam appears to be 5 out of 5 distally her lower 70s approximately lower 70s his be somewhat pain limited but she is at least 4-4+ out of 5 well over antigravity bilateral and symmetric. Abdominal exam reveals mildly distended but no peritoneal signs  Apparently urine output was round. 40 cc to the first 7 or 8 hours of this shift however since we gave her fluid bolus the spinal canal 50 cc over the last hour. We're still awaiting the results of her BMP and CBC chest x-ray was negative where in order a KUB canal get the hospitalist to come take a look at her as well.

## 2014-02-28 NOTE — Procedures (Signed)
Central Venous Catheter Insertion Procedure Note Alexis King [id number] 10/15/1945  Procedure: Insertion of Central Venous Catheter Indications: Assessment of intravascular volume, Drug and/or fluid administration and Frequent blood sampling  Procedure Details Consent: Risks of procedure as well as the alternatives and risks of each were explained to the (patient/caregiver).  Consent for procedure obtained. Time Out: Verified patient identification, verified procedure, site/side was marked, verified correct patient position, special equipment/implants available, medications/allergies/relevent history reviewed, required imaging and test results available.  Performed  Maximum sterile technique was used including antiseptics, cap, gloves, gown, hand hygiene and sheet. Skin prep: Chlorhexidine; local anesthetic administered A antimicrobial bonded/coated triple lumen catheter was placed in the right internal jugular vein using the Seldinger technique.  Evaluation Blood flow good Complications: No apparent complications Patient did tolerate procedure well. Chest X-ray ordered to verify placement.  CXR: pending.  Alexis King 02/28/2014, 10:27 PM

## 2014-02-28 NOTE — Progress Notes (Signed)
Subjective: Patient resting in bed, reports that he is less uncomfortable than yesterday. Continuing to work with PT and OT. Patient's nurse not available to review condition and plans for treatment and care  Objective: Vital signs in last 24 hours: Filed Vitals:   02/27/14 1218 02/27/14 1948 02/27/14 2337 02/28/14 0411  BP: 111/68 131/56 107/55 110/59  Pulse:  93 83 78  Temp:  99 F (37.2 C) 98.5 F (36.9 C) 97.7 F (36.5 C)  TempSrc:  Oral Oral Oral  Resp: 28 21 25 28   Height:      Weight:      SpO2:  92% 94% 96%    Intake/Output from previous day: 07/11 0701 - 07/12 0700 In: 1337.5 [P.O.:240; I.V.:1097.5] Out: 9798 [Urine:3475] Intake/Output this shift:    Physical Exam:  Moving all 4 extremities well. Dressing is clean and dry.  Assessment/Plan: Continue PT and OT. Once more mobile and ambulating, will DC Foley.   Hosie Spangle, MD 02/28/2014, 8:57 AM

## 2014-02-28 NOTE — Consult Note (Signed)
Reason for Consult: Lethargic, hypotension and decreased urine output. Referring Physician: Dr. Saintclair Halsted. Neurosurgeon.  Alexis King is an 68 y.o. female.  HPI: With history of hypertension diabetes mellitus and hypothyroidism had lumbar surgery 4 days ago. Patient was found to be increasingly confused with low blood pressure and decreased urine output today. Consult was called for further management. On exam patient is oriented to his name and place and follows commands. Moves all extremities. Patient was initially hypotensive with blood pressure in the 80 systolic which improved with IV bolus fluid. Presently blood pressure is in the 604 systolic. Patient has abdominal discomfort and on exam patient has mild abdominal distention. There is no active discharge from the incision site in the right flank area. Patient denies any chest pain or shortness of breath and did not have any nausea vomiting. Labs have been sent and shows mildly increased creatinine from baseline with lower hemoglobin from baseline. Patient's left upper extremity has developed IV infiltration a few minutes ago and is edematous. Patient also has received 2 doses of pain medications today so far.  Past Medical History  Diagnosis Date  . Hypertension   . Hyperlipidemia   . Depression   . Diabetes mellitus   . IBS (irritable bowel syndrome)   . Basal cell carcinoma of skin   . Coronary artery disease   . Dysrhythmia   . Pneumonia     early this year  . H/O hiatal hernia     otc  . GERD (gastroesophageal reflux disease)   . Arthritis   . Hypothyroidism     Past Surgical History  Procedure Laterality Date  . Cholecystectomy    . Cesarean section    . Lacrimal duct probing    . Basal cell carcinoma excision    . Thyroidectomy, partial    . Dilation and curettage of uterus    . Dental surgery      will have dental surgery 02/24/14, Root canal  . Root canal  02-24-14    Family History  Problem Relation Age of Onset   . Heart disease Father   . Arthritis Mother   . Pulmonary fibrosis Mother   . Skin cancer Mother   . Skin cancer Father     Social History:  reports that she has never smoked. She has never used smokeless tobacco. She reports that she drinks alcohol. She reports that she does not use illicit drugs.  Allergies:  Allergies  Allergen Reactions  . Ace Inhibitors Cough  . Clindamycin/Lincomycin     Severe stomach cramps  . Levaquin [Levofloxacin In D5w] Other (See Comments)    Achillis tendon pain  . Meloxicam Nausea Only    Stomach Cramps  . Sulfa Antibiotics Rash    Medications: I have reviewed the patient's current medications.  Results for orders placed during the hospital encounter of 02/25/14 (from the past 48 hour(s))  GLUCOSE, CAPILLARY     Status: Abnormal   Collection Time    02/26/14  9:21 PM      Result Value Ref Range   Glucose-Capillary 152 (*) 70 - 99 mg/dL   Comment 1 Documented in Chart     Comment 2 Notify RN    GLUCOSE, CAPILLARY     Status: Abnormal   Collection Time    02/27/14  8:12 AM      Result Value Ref Range   Glucose-Capillary 162 (*) 70 - 99 mg/dL  GLUCOSE, CAPILLARY     Status: Abnormal  Collection Time    02/27/14 11:22 AM      Result Value Ref Range   Glucose-Capillary 140 (*) 70 - 99 mg/dL   Comment 1 Documented in Chart     Comment 2 Notify RN    GLUCOSE, CAPILLARY     Status: Abnormal   Collection Time    02/27/14  9:19 PM      Result Value Ref Range   Glucose-Capillary 155 (*) 70 - 99 mg/dL   Comment 1 Notify RN    GLUCOSE, CAPILLARY     Status: Abnormal   Collection Time    02/28/14  8:29 AM      Result Value Ref Range   Glucose-Capillary 125 (*) 70 - 99 mg/dL   Comment 1 Notify RN     Comment 2 Documented in Chart    BASIC METABOLIC PANEL     Status: Abnormal   Collection Time    02/28/14  6:06 PM      Result Value Ref Range   Sodium 131 (*) 137 - 147 mEq/L   Potassium 4.3  3.7 - 5.3 mEq/L   Chloride 88 (*) 96 - 112  mEq/L   CO2 28  19 - 32 mEq/L   Glucose, Bld 105 (*) 70 - 99 mg/dL   BUN 21  6 - 23 mg/dL   Creatinine, Ser 1.23 (*) 0.50 - 1.10 mg/dL   Calcium 8.3 (*) 8.4 - 10.5 mg/dL   GFR calc non Af Amer 44 (*) >90 mL/min   GFR calc Af Amer 51 (*) >90 mL/min   Comment: (NOTE)     The eGFR has been calculated using the CKD EPI equation.     This calculation has not been validated in all clinical situations.     eGFR's persistently <90 mL/min signify possible Chronic Kidney     Disease.   Anion gap 15  5 - 15  CBC WITH DIFFERENTIAL     Status: Abnormal   Collection Time    02/28/14  6:06 PM      Result Value Ref Range   WBC 13.4 (*) 4.0 - 10.5 K/uL   RBC 3.18 (*) 3.87 - 5.11 MIL/uL   Hemoglobin 10.3 (*) 12.0 - 15.0 g/dL   HCT 29.7 (*) 36.0 - 46.0 %   MCV 93.4  78.0 - 100.0 fL   MCH 32.4  26.0 - 34.0 pg   MCHC 34.7  30.0 - 36.0 g/dL   RDW 12.1  11.5 - 15.5 %   Platelets 206  150 - 400 K/uL   Neutrophils Relative % 81 (*) 43 - 77 %   Neutro Abs 10.8 (*) 1.7 - 7.7 K/uL   Lymphocytes Relative 10 (*) 12 - 46 %   Lymphs Abs 1.4  0.7 - 4.0 K/uL   Monocytes Relative 8  3 - 12 %   Monocytes Absolute 1.0  0.1 - 1.0 K/uL   Eosinophils Relative 1  0 - 5 %   Eosinophils Absolute 0.2  0.0 - 0.7 K/uL   Basophils Relative 0  0 - 1 %   Basophils Absolute 0.0  0.0 - 0.1 K/uL    Dg Chest Port 1 View  02/28/2014   CLINICAL DATA:  Lethargy.  EXAM: PORTABLE CHEST - 1 VIEW  COMPARISON:  02/11/2014  FINDINGS: Heart size and pulmonary vascularity are normal. There is slight chronic accentuation of the interstitial markings at the left lung base. No acute infiltrates or effusions. No acute osseous abnormality.  IMPRESSION: No acute abnormalities.   Electronically Signed   By: Rozetta Nunnery M.D.   On: 02/28/2014 19:08    Review of Systems  Constitutional: Negative.   HENT: Negative.   Eyes: Negative.   Respiratory: Negative.   Cardiovascular: Negative.   Gastrointestinal: Positive for abdominal pain.   Genitourinary: Negative.   Musculoskeletal: Positive for back pain.  Skin: Negative.   Neurological:       Mental status changes.  Endo/Heme/Allergies: Negative.   Psychiatric/Behavioral: Negative.    Blood pressure 115/55, pulse 81, temperature 98.2 F (36.8 C), temperature source Oral, resp. rate 20, height 5' 1.75" (1.568 m), weight 102.9 kg (226 lb 13.7 oz), SpO2 99.00%. Physical Exam  Constitutional: She appears well-developed and well-nourished. No distress.  HENT:  Head: Normocephalic and atraumatic.  Right Ear: External ear normal.  Left Ear: External ear normal.  Nose: Nose normal.  Mouth/Throat: Oropharynx is clear and moist. No oropharyngeal exudate.  Eyes: Conjunctivae are normal. Pupils are equal, round, and reactive to light. Right eye exhibits no discharge. Left eye exhibits no discharge. No scleral icterus.  Neck: Normal range of motion. Neck supple.  Cardiovascular: Normal rate and regular rhythm.   Respiratory: Effort normal and breath sounds normal. No respiratory distress. She has no wheezes. She has no rales.  GI: She exhibits distension. There is no tenderness. There is no rebound and no guarding.  Musculoskeletal: She exhibits no edema and no tenderness.  Neurological:  Patient appears confused. Moves all extremities.  Skin: Skin is warm and dry. She is not diaphoretic.    Assessment/Plan: #1. Hypotension - probably from pain medications and possible decreased volume status. Patient is afebrile and does not look septic so far. I have continued with normal saline at 150 cc per hour. Hold antihypertensives. Check procalcitonin levels and urine urine culture and cortisol levels. #2. Acute renal failure - probably from dehydration and hypotension. Hold antihypertensives and hydrate. Check urine studies. CT abdomen and pelvis is ordered for to check for any hydronephrosis. #3. Abdominal distention - possibility of developing ileus. CT abdomen and pelvis is  pending. #4. Acute encephalopathy - probably related to pain medications. Patient's nonfocal otherwise. CT head is pending. #5. Mild leukocytosis - follow urine studies. If shows features compatible with UTI will start antibiotics. #6. Diabetes mellitus - for now place patient on sliding-scale coverage. #7. Hypothyroidism - continue Synthroid. #8. History of hypertension - see #1 and #2.  Thanks for involving [place] in patient's care. We will follow along with you.  Rise Patience 02/28/2014, 8:33 PM

## 2014-02-28 NOTE — Progress Notes (Signed)
Dear Doctor:   This patient has been identified as a candidate for PICC for the following reason (s): IV therapy over 48 hours, poor veins/poor circulatory system (CHF, COPD, emphysema, diabetes, steroid use, IV drug abuse, etc.) and restarts due to phlebitis and infiltration in 24 hours If you agree, please write an order for the indicated device. For any questions contact the Vascular Access Team at [phone] if no answer, please leave a message.  Thank you for supporting the early vascular access assessment program.

## 2014-02-28 NOTE — Progress Notes (Signed)
Physical Therapy Treatment Patient Details Name: Alexis King MRN: [id number] DOB: May 20, 1946 Today's Date: 02/28/2014    History of Present Illness      PT Comments    Treatment today limited by pain.  D/C plan may need to be revised if no increase in progress noted.  Follow Up Recommendations  Home health PT;Supervision for mobility/OOB     Equipment Recommendations  Rolling walker with 5" wheels;3in1 (PT)    Recommendations for Other Services       Precautions / Restrictions Precautions Precautions: Back;Fall Precaution Comments: Reviewed 3/3 back precautions. Required Braces or Orthoses: Spinal Brace Spinal Brace: Lumbar corset;Applied in sitting position    Mobility  Bed Mobility     Rolling: Mod assist Sidelying to sit: Mod assist;+2 for physical assistance;HOB elevated       General bed mobility comments: verbal cues for sequencing, precautions  Transfers   Equipment used: Rolling walker (2 wheeled)   Sit to Stand: Mod assist;+2 physical assistance         General transfer comment: verbal/tactile cues for hand placement, technique; Pt having difficulty with full extension of BLE in order to come to stance.  Ambulation/Gait Ambulation/Gait assistance: Min assist Ambulation Distance (Feet): 5 Feet Assistive device: Rolling walker (2 wheeled) Gait Pattern/deviations: Step-to pattern;Wide base of support;Decreased step length - right Gait velocity: decreased   General Gait Details: verbal cues to narrow BOS and for sequencing   Stairs            Wheelchair Mobility    Modified Rankin (Stroke Patients Only)       Balance                                    Cognition Arousal/Alertness: Awake/alert Behavior During Therapy: WFL for tasks assessed/performed Overall Cognitive Status: Within Functional Limits for tasks assessed                      Exercises      General Comments        Pertinent  Vitals/Pain 9/10    Home Living                      Prior Function            PT Goals (current goals can now be found in the care plan section) Progress towards PT goals: Not progressing toward goals - comment (pain hindering progress)    Frequency  Min 5X/week    PT Plan Current plan remains appropriate    Co-evaluation             End of Session Equipment Utilized During Treatment: Gait belt;Back brace Activity Tolerance: Patient limited by pain Patient left: in chair;with call bell/phone within reach     Time: 1050-1107 PT Time Calculation (min): 17 min  Charges:  $Therapeutic Activity: 23-37 mins                    G Codes:      Lorriane Shire 02/28/2014, 12:05 PM  Lorrin Goodell, PT  Office # [phone] Pager [phone]

## 2014-03-01 DIAGNOSIS — E860 Dehydration: Secondary | ICD-10-CM | POA: Diagnosis present

## 2014-03-01 DIAGNOSIS — J9601 Acute respiratory failure with hypoxia: Secondary | ICD-10-CM | POA: Diagnosis present

## 2014-03-01 DIAGNOSIS — E871 Hypo-osmolality and hyponatremia: Secondary | ICD-10-CM

## 2014-03-01 DIAGNOSIS — R4182 Altered mental status, unspecified: Secondary | ICD-10-CM

## 2014-03-01 DIAGNOSIS — I959 Hypotension, unspecified: Secondary | ICD-10-CM | POA: Diagnosis present

## 2014-03-01 LAB — CBC WITH DIFFERENTIAL/PLATELET
BASOS PCT: 0 % (ref 0–1)
Basophils Absolute: 0 10*3/uL (ref 0.0–0.1)
EOS ABS: 0.2 10*3/uL (ref 0.0–0.7)
EOS PCT: 2 % (ref 0–5)
HEMATOCRIT: 28.2 % — AB (ref 36.0–46.0)
Hemoglobin: 9.8 g/dL — ABNORMAL LOW (ref 12.0–15.0)
Lymphocytes Relative: 12 % (ref 12–46)
Lymphs Abs: 1.4 10*3/uL (ref 0.7–4.0)
MCH: 32.2 pg (ref 26.0–34.0)
MCHC: 34.8 g/dL (ref 30.0–36.0)
MCV: 92.8 fL (ref 78.0–100.0)
MONO ABS: 0.8 10*3/uL (ref 0.1–1.0)
Monocytes Relative: 7 % (ref 3–12)
Neutro Abs: 9.1 10*3/uL — ABNORMAL HIGH (ref 1.7–7.7)
Neutrophils Relative %: 79 % — ABNORMAL HIGH (ref 43–77)
Platelets: 199 10*3/uL (ref 150–400)
RBC: 3.04 MIL/uL — ABNORMAL LOW (ref 3.87–5.11)
RDW: 12.3 % (ref 11.5–15.5)
WBC: 11.5 10*3/uL — ABNORMAL HIGH (ref 4.0–10.5)

## 2014-03-01 LAB — URINALYSIS, ROUTINE W REFLEX MICROSCOPIC
BILIRUBIN URINE: NEGATIVE
GLUCOSE, UA: NEGATIVE mg/dL
Ketones, ur: 15 mg/dL — AB
Nitrite: POSITIVE — AB
Protein, ur: NEGATIVE mg/dL
Specific Gravity, Urine: 1.012 (ref 1.005–1.030)
Urobilinogen, UA: 0.2 mg/dL (ref 0.0–1.0)
pH: 5.5 (ref 5.0–8.0)

## 2014-03-01 LAB — COMPREHENSIVE METABOLIC PANEL
ALK PHOS: 62 U/L (ref 39–117)
ALT: 24 U/L (ref 0–35)
AST: 30 U/L (ref 0–37)
Albumin: 2.6 g/dL — ABNORMAL LOW (ref 3.5–5.2)
Anion gap: 12 (ref 5–15)
BUN: 18 mg/dL (ref 6–23)
CO2: 28 mEq/L (ref 19–32)
Calcium: 8.2 mg/dL — ABNORMAL LOW (ref 8.4–10.5)
Chloride: 94 mEq/L — ABNORMAL LOW (ref 96–112)
Creatinine, Ser: 0.86 mg/dL (ref 0.50–1.10)
GFR calc non Af Amer: 67 mL/min — ABNORMAL LOW (ref >90)
GFR, EST AFRICAN AMERICAN: 78 mL/min — AB (ref >90)
GLUCOSE: 99 mg/dL (ref 70–99)
POTASSIUM: 3.9 meq/L (ref 3.7–5.3)
SODIUM: 134 meq/L — AB (ref 137–147)
Total Bilirubin: 0.6 mg/dL (ref 0.3–1.2)
Total Protein: 6.4 g/dL (ref 6.0–8.3)

## 2014-03-01 LAB — URINE MICROSCOPIC-ADD ON

## 2014-03-01 LAB — SODIUM, URINE, RANDOM: Sodium, Ur: 67 mEq/L

## 2014-03-01 LAB — GLUCOSE, CAPILLARY
GLUCOSE-CAPILLARY: 92 mg/dL (ref 70–99)
GLUCOSE-CAPILLARY: 139 mg/dL — AB (ref 70–99)
Glucose-Capillary: 106 mg/dL — ABNORMAL HIGH (ref 70–99)
Glucose-Capillary: 172 mg/dL — ABNORMAL HIGH (ref 70–99)

## 2014-03-01 LAB — CORTISOL: Cortisol, Plasma: 25.3 ug/dL

## 2014-03-01 LAB — CREATININE, URINE, RANDOM: Creatinine, Urine: 47.3 mg/dL

## 2014-03-01 MED ORDER — DEXTROSE 5 % IV SOLN
1.0000 g | Freq: Every day | INTRAVENOUS | Status: DC
  Administered 2014-03-01 – 2014-03-04 (×4): 1 g via INTRAVENOUS
  Filled 2014-03-01 (×4): qty 10

## 2014-03-01 MED ORDER — GLUCERNA SHAKE PO LIQD
237.0000 mL | Freq: Three times a day (TID) | ORAL | Status: DC
  Administered 2014-03-01 – 2014-03-04 (×7): 237 mL via ORAL

## 2014-03-01 MED ORDER — SODIUM CHLORIDE 0.9 % IV SOLN
INTRAVENOUS | Status: DC
  Administered 2014-03-01 – 2014-03-02 (×2): via INTRAVENOUS

## 2014-03-01 NOTE — Progress Notes (Signed)
[facility]U [facility]  Alexis King DDU:[id number] DOB: [birth year] DOA: 02/25/2014 PCP: Leandrew Koyanagi, MD  Brief narrative: 68 y.o. Female with a history of hypertension, diabetes mellitus and hypothyroidism. Had lumbar surgery on 02/25/2014. She was noted to have progressive confusion, hypotension and decreased urine output on 02/28/2014. Upon exam she was documented as oriented to name and place and was without focal neuro deficits  Patient was initially hypotensive with blood pressure in the 23'J systolic which improved with IV boluses. After the fluid challenges her BP rebounded to 628 systolic. Patient had abdominal discomfort which was reproducible with exam. There was no drainage from the incision site in the right flank area. Patient denied any chest pain or shortness of breath and did not have any nausea or vomiting. Labs revealed only mild elevation of creatinine and mildly lower hemoglobin from baseline. She had received 2 doses of IV morphine prior to onset of symptoms.  HPI/Subjective: Feels much better than yesterday- states has little recollection of the events of yesterday  Assessment/Plan:    Hypotension -resolved after hydration and dc of offending meds (diuretics and anti HTNs)    Dehydration with hyponatremia -cont IVFs - Na increasing with hydration -urine sodium unreliable in setting of recent use of diuretics    Abnormal urinalysis -appears c/w UTI -ck culture -cont Rocephin   Abdominal pain -resolved-CT unrevealing-likey related to UTI    Acute respiratory failure with hypoxia -seems mediated by hypoventilation during hypotension and encephalopathy -CXR unremarkable    HTN  -BP soft so cont to hold home meds -monitor for breakthru tachycardia since was on Coreg pre admit    DM  -CBGs controlled -cont SSI and meal coverage -holding home Metformin    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 rate 17, height 5' 1.75" (1.568 m), weight 226 lb 13.7 oz (102.9 kg), SpO2 99.00%.  Intake/Output Summary (Last 24 hours) at 03/01/14 1350 Last data filed at 03/01/14 1300  Gross per 24 hour  Intake   2280 ml  Output   2775 ml  Net   -495 ml    Exam: General: No acute respiratory distress-more awake today than as described at time of initial consultation yesterday evening Lungs: Clear to auscultation bilaterally without wheezes or crackles, 2L Cardiovascular: Regular rate and rhythm without murmur gallop or rub normal S1 and S2, no peripheral edema  Abdomen: Nontender, nondistended, soft, bowel sounds positive, no rebound, no ascites, no appreciable mass Musculoskeletal: No significant cyanosis, clubbing of bilateral lower extremities   Scheduled Meds:  Scheduled Meds: . cefTRIAXone (ROCEPHIN)  IV  1 g Intravenous Daily  . docusate sodium  100 mg Oral BID  . fluticasone  2 spray Each Nare QHS  . insulin aspart  0-15 Units Subcutaneous TID WC  . insulin aspart  0-5 Units Subcutaneous QHS  . insulin aspart  4 Units Subcutaneous TID WC  . levothyroxine  150 mcg Oral QAC breakfast  .  pantoprazole  40 mg Oral QHS  . sertraline  100 mg Oral Daily  . sodium chloride  3 mL Intravenous Q12H    Data Reviewed: Basic Metabolic Panel:  Recent Labs Lab 02/28/14 1806 03/01/14 0545  NA 131* 134*  K 4.3 3.9  CL 88*  94*  CO2 28 28  GLUCOSE 105* 99  BUN 21 18  CREATININE 1.23* 0.86  CALCIUM 8.3* 8.2*   Liver Function Tests:  Recent Labs Lab 02/28/14 2150 03/01/14 0545  AST 29 30  ALT 25 24  ALKPHOS 62 62  BILITOT 0.6 0.6  PROT 6.4 6.4  ALBUMIN 2.7* 2.6*    Recent Labs Lab 02/28/14 2150  AMMONIA 10*   CBC:  Recent Labs Lab 02/28/14 1806 03/01/14 0545  WBC 13.4* 11.5*  NEUTROABS 10.8* 9.1*  HGB 10.3* 9.8*  HCT 29.7* 28.2*  MCV 93.4 92.8  PLT 206 199   Cardiac Enzymes:  Recent Labs Lab 02/28/14 2150  CKTOTAL 667*  TROPONINI <0.30   CBG:  Recent Labs Lab 02/27/14 2119 02/28/14 0829 02/28/14 1135 02/28/14 1517 02/28/14 2136  GLUCAP 155* 125* 152* 101* 106*    Studies:  Recent x-ray studies have been reviewed in detail by the Attending Physician  Time spent :  35 mins  Erin Hearing, ANP [facility] Office  [phone] Pager [phone]  **If unable to reach the above provider after paging please contact the [place] @ [phone]  On-Call/Text Page:      [email or web address]      password [facility]  03/01/2014, 1:50 PM   LOS: 4 days   I have personally examined this patient and reviewed the entire database. I have reviewed the above note, made any necessary editorial changes, and agree with its content.  Cherene Altes, MD [facility]

## 2014-03-01 NOTE — Progress Notes (Signed)
Utilization review completed.  

## 2014-03-01 NOTE — Progress Notes (Signed)
Ok to use CL.  Dr. Ancil Linsey notified

## 2014-03-01 NOTE — Progress Notes (Signed)
Patient ID: Alexis King, female   DOB: November 29, 1945, 68 y.o.   MRN: [id number] Subjective: Patient reports feeling better today  Objective: Vital signs in last 24 hours: Temp:  [98.2 F (36.8 C)-98.8 F (37.1 C)] 98.5 F (36.9 C) (07/13 0401) Pulse Rate:  [69-81] 69 (07/13 0401) Resp:  [16-21] 17 (07/13 0401) BP: (99-121)/(48-62) 121/48 mmHg (07/13 0401) SpO2:  [97 %-100 %] 97 % (07/13 0401)  Intake/Output from previous day: 07/12 0701 - 07/13 0700 In: 1840 [I.V.:1840] Out: 2025 [Urine:2025] Intake/Output this shift:    awake, alert, conversant; follows complex commands;moves all 4 extremities  Lab Results:  Recent Labs  02/28/14 1806 03/01/14 0545  WBC 13.4* 11.5*  HGB 10.3* 9.8*  HCT 29.7* 28.2*  PLT 206 199   BMET  Recent Labs  02/28/14 1806 03/01/14 0545  NA 131* 134*  K 4.3 3.9  CL 88* 94*  CO2 28 28  GLUCOSE 105* 99  BUN 21 18  CREATININE 1.23* 0.86  CALCIUM 8.3* 8.2*    Studies/Results: Ct Abdomen Pelvis Wo Contrast  02/28/2014   CLINICAL DATA:  Abdominal pain.  Check for hydronephrosis.  EXAM: CT ABDOMEN AND PELVIS WITHOUT CONTRAST  TECHNIQUE: Multidetector CT imaging of the abdomen and pelvis was performed following the standard protocol without IV contrast.  COMPARISON:  None.  FINDINGS: BODY WALL: Body wall edema, more extensive on the right after surgery.  LOWER CHEST: Borderline cardiomegaly. Bandlike opacities in the lower lungs consistent with atelectasis.  ABDOMEN/PELVIS:  Liver: No focal abnormality.  Biliary: Cholecystectomy.  Pancreas: Unremarkable.  Spleen: Unremarkable.  Adrenals: Unremarkable.  Kidneys and ureters: No hydronephrosis or stone.  Bladder: Completely decompressed by Foley catheter.  Reproductive: The right ovary is difficult to discern due to neighboring bowel loops.  Bowel: No obstruction. Negative appendix.  Retroperitoneum: Edema in the retroperitoneum centered around the postoperative lumbar spine levels. These changes are  fairly symmetric, involving the fat and bilateral psoas musculature. There are few scattered locules of gas. No evidence of fluid collection. Minimal herniation of retroperitoneal fat in the region of the right flank incision.  Peritoneum: No ascites or pneumoperitoneum.  Vascular: Abdominal aortic atherosclerosis. Edema surrounds the lower cava and aorta, without evidence of hematoma or aneurysm.  OSSEOUS: Interval L2-3, L3-4, and L4-5 anterior lateral discectomy. There has also been posterior fusion using rod and pedicle screws at the same levels. Interbody cages are stable compared to intraoperative fluoroscopy. No evidence of fixation hardware displacement. No acute fracture.  IMPRESSION: 1. No hydronephrosis or urinary bladder retention. 2. Expected postoperative changes after L2-3, L3-4, L4-5 discectomy and fixation.   Electronically Signed   By: Jorje Guild M.D.   On: 02/28/2014 21:49   Ct Head Wo Contrast  03/01/2014   CLINICAL DATA:  Confusion.  EXAM: CT HEAD WITHOUT CONTRAST  TECHNIQUE: Contiguous axial images were obtained from the base of the skull through the vertex without intravenous contrast.  COMPARISON:  07/03/2013  FINDINGS: Skull and Sinuses:Negative for fracture or destructive process. The mastoids, middle ears, and imaged paranasal sinuses are clear.  Orbits: No acute abnormality.  Brain: No evidence of acute abnormality, such as acute infarction, hemorrhage, hydrocephalus, or mass lesion/mass effect. There is chronic small vessel disease with ischemic gliosis noted around the lateral ventricles, especially the frontal horns. 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 Generalized cerebral volume loss which is age appropriate.  IMPRESSION: No acute intracranial abnormality.   Electronically Signed   By: Gilford Silvius.D.  On: 03/01/2014 04:12   Dg Chest Port 1 View  03/01/2014   CLINICAL DATA:  Central line insertion.  EXAM: PORTABLE CHEST - 1 VIEW  COMPARISON:   02/28/2014.  FINDINGS: Unchanged cardiopericardial enlargement. There is coarsening of lung markings at the bases, atelectasis based on recent abdominal CT. Widening of the upper mediastinum which is likely positional.  New right IJ central line, tip at the upper cavoatrial junction. No evidence of pneumothorax or increasing pleural fluid.  IMPRESSION: New right IJ central line.  No adverse features.   Electronically Signed   By: Jorje Guild M.D.   On: 03/01/2014 04:59   Dg Chest Port 1 View  02/28/2014   CLINICAL DATA:  Lethargy.  EXAM: PORTABLE CHEST - 1 VIEW  COMPARISON:  02/11/2014  FINDINGS: Heart size and pulmonary vascularity are normal. There is slight chronic accentuation of the interstitial markings at the left lung base. No acute infiltrates or effusions. No acute osseous abnormality.  IMPRESSION: No acute abnormalities.   Electronically Signed   By: Rozetta Nunnery M.D.   On: 02/28/2014 19:08    Assessment/Plan: Doing better today. Appreciate CCM management; will start to increase activity today; will get Rehab consult to assess CIR appropriateness   LOS: 4 days  as above   Faythe Ghee, MD 03/01/2014, 8:33 AM

## 2014-03-01 NOTE — Progress Notes (Signed)
Physical Therapy Treatment Patient Details Name: Alexis King MRN: [id number] DOB: 05-Nov-1945 Today's Date: 03/01/2014    History of Present Illness Pt admitted for lumbar fusion with ALIF L1-3 and TLIF L3-5. pt with root canal 02/24/14    PT Comments    Patient seen for therapy session today, patient still very limited and not making significant progress with mobility at this time. Given weakness, gait deviations and deficits in functional mobility, patient will need rehab stay prior to discharge home. Recommend CIR consult.   Follow Up Recommendations  CIR     Equipment Recommendations  Rolling walker with 5" wheels;3in1 (PT)    Recommendations for Other Services       Precautions / Restrictions Precautions Precautions: Back;Fall Precaution Comments: Reviewed 3/3 back precautions. Required Braces or Orthoses: Spinal Brace Spinal Brace: Lumbar corset;Applied in sitting position    Mobility  Bed Mobility Overal bed mobility: Needs Assistance Bed Mobility: Rolling;Sidelying to Sit Rolling: Mod assist Sidelying to sit: Mod assist;+2 for physical assistance;HOB elevated     Sit to sidelying: Mod assist General bed mobility comments: verbal cues for sequencing, precautions  Transfers Overall transfer level: Needs assistance   Transfers: Sit to/from Stand;Stand Pivot Transfers Sit to Stand: +2 physical assistance;Min assist Stand pivot transfers: Mod assist       General transfer comment: verbal/tactile cues for hand placement, technique; Pt having difficulty with full extension of BLE in order to come to stance.  Ambulation/Gait Ambulation/Gait assistance: Min assist Ambulation Distance (Feet): 8 Feet Assistive device: Rolling walker (2 wheeled) Gait Pattern/deviations: Step-to pattern;Wide base of support;Decreased step length - right Gait velocity: decreased Gait velocity interpretation: <1.8 ft/sec, indicative of risk for recurrent falls General Gait  Details: difficulty advancing RLE and weight shifting toward left side, Cues for increased shift, assist for stability. VCs for upright posture.   Stairs            Wheelchair Mobility    Modified Rankin (Stroke Patients Only)       Balance                                    Cognition Arousal/Alertness: Awake/alert Behavior During Therapy: WFL for tasks assessed/performed Overall Cognitive Status: Within Functional Limits for tasks assessed                      Exercises      General Comments        Pertinent Vitals/Pain 4/10 pain, VSS, Patient did report dizziness upon standing     Home Living                      Prior Function            PT Goals (current goals can now be found in the care plan section) Acute Rehab PT Goals Patient Stated Goal: be able to get back to walking and caring for spouse PT Goal Formulation: With patient Time For Goal Achievement: 03/12/14 Potential to Achieve Goals: Fair Progress towards PT goals: Not progressing toward goals - comment    Frequency  Min 5X/week    PT Plan Discharge plan needs to be updated    Co-evaluation             End of Session Equipment Utilized During Treatment: Back brace;Gait belt Activity Tolerance: Patient limited by pain Patient left: in chair;with call bell/phone within  reach     Time: 8811-0315 PT Time Calculation (min): 25 min  Charges:  $Gait Training: 8-22 mins                    G CodesDuncan Dull 03-11-14, 5:12 PM Alben Deeds, [place] DPT  [phone]

## 2014-03-01 NOTE — Progress Notes (Signed)
Occupational Therapy Evaluation Patient Details Name: Alexis King MRN: [id number] DOB: [birth year] Today's Date: 03/01/2014    History of Present Illness Pt admitted for lumbar fusion with ALIF L1-3 and TLIF L3-5. pt with root canal 02/24/14     Clinical Impression  She was noted to have progressive confusion, hypotension and decreased urine output on 02/28/2014. Increased c/o RLE weakness.  Pt making slow progress. Demonstrates RLE weakness (per pt this is new since surgery) and c/o general weakness. Do not feel pt will be able to D/C home and would benefit from CIR to maximize independence and safety with ADL and functional mobility.  Pt in agreement. Will continue to follow to address goals below.    Follow Up Recommendations  CIR;Supervision/Assistance - 24 hour    Equipment Recommendations  3 in 1 bedside comode (wide)    Recommendations for Other Services Rehab consult     Precautions / Restrictions Precautions Precautions: Back;Fall Precaution Comments: Reviewed 3/3 back precautions. Required Braces or Orthoses: Spinal Brace Spinal Brace: Lumbar corset;Applied in sitting position      Mobility Bed Mobility Overal bed mobility: Needs Assistance Bed Mobility: Rolling;Sidelying to Sit Rolling: Mod assist Sidelying to sit: Mod assist;+2 for physical assistance;HOB elevated     Sit to sidelying: Mod assist General bed mobility comments: verbal cues for sequencing, precautions  Transfers Overall transfer level: Needs assistance   Transfers: Sit to/from Stand;Stand Pivot Transfers Sit to Stand: +2 physical assistance;Min assist Stand pivot transfers: Mod assist       General transfer comment: verbal/tactile cues for hand placement, technique; Pt having difficulty with full extension of BLE in order to come to stance. Pt only able to walk @ 4-5 steps before asking to sit down    Balance                                            ADL  Overall ADL's : Needs assistance/impaired                     Lower Body Dressing: Total assistance;Sit to/from stand without AE   Toilet Transfer: Minimal assistance   Toileting- Clothing Manipulation and Hygiene: Moderate assistance;Sit to/from stand       Functional mobility during ADLs:  (min with sit - stand. mod a with ambulation due to RLE weakness) General ADL Comments: Educated on use on AE. After mobility, pt stating she is paiful and tired and would try later. Able to recall 2/3 precautions.     Vision                     Perception     Praxis      Pertinent Vitals/Pain 8/10. Back. Pt gave pain meds     Hand Dominance     Extremity/Trunk Assessment             Communication     Cognition Arousal/Alertness: Awake/alert Behavior During Therapy: WFL for tasks assessed/performed Overall Cognitive Status: Within Functional Limits for tasks assessed                     General Comments       Exercises       Shoulder Instructions      Home Living  Prior Functioning/Environment               OT Diagnosis:     OT Problem List:     OT Treatment/Interventions:      OT Goals(Current goals can be found in the care plan section) Acute Rehab OT Goals Patient Stated Goal: be able to get back to walking and caring for spouse OT Goal Formulation: With patient Time For Goal Achievement: 03/05/14 Potential to Achieve Goals: Good ADL Goals Pt Will Perform Grooming: with supervision;standing Pt Will Perform Upper Body Bathing: with supervision;sitting;with set-up Pt Will Perform Lower Body Bathing: with supervision;with adaptive equipment;sit to/from stand Pt Will Perform Upper Body Dressing: with supervision;sitting Pt Will Perform Lower Body Dressing: with supervision;with adaptive equipment;sit to/from stand Pt Will Transfer to Toilet: with  supervision;ambulating;regular height toilet;bedside commode;grab bars Pt Will Perform Toileting - Clothing Manipulation and hygiene: with supervision;sit to/from stand;with adaptive equipment Pt Will Perform Tub/Shower Transfer: Shower transfer;with supervision;ambulating;shower seat;rolling walker Additional ADL Goal #1: Pt will be independent with back precautions during all BADLs and functional mobility   OT Frequency: Min 3X/week   Barriers to D/C:            Co-evaluation              End of Session Equipment Utilized During Treatment: Rolling walker;Back brace;Gait belt Nurse Communication: Mobility status  Activity Tolerance: Patient limited by fatigue Patient left: in chair;with call bell/phone within reach;with family/visitor present   Time: 8657-8469 OT Time Calculation (min): 25 min Charges:  OT General Charges $OT Visit: 1 Procedure OT Treatments $Self Care/Home Management : 8-22 mins G-Codes:    Garritt Molyneux,HILLARY Mar 29, 2014, 4:37 PM   [facility], OTR/L  [phone] March 29, 2014

## 2014-03-01 NOTE — Progress Notes (Signed)
Physical medicine and rehabilitation consult requested with chart reviewed. Patient is  [facility] and will not justify inpatient rehabilitation services. Physical therapy recommended home with home health therapies. Hold on formal rehabilitation consult at this time

## 2014-03-01 NOTE — Progress Notes (Signed)
INITIAL NUTRITION ASSESSMENT  DOCUMENTATION CODES Per approved criteria  -Morbid Obesity   INTERVENTION:  Glucerna Shake po TID, each supplement provides 220 kcal and 10 grams of protein  NUTRITION DIAGNOSIS: Inadequate oral intake related to poor appetite as evidenced by minimal intake of meals.   Goal: Intake to meet >90% of estimated nutrition needs.  Monitor:  PO intake, labs, weight trend.  Reason for Assessment: MD Consult  68 y.o. female  Admitting Dx: Back and Leg Pain  ASSESSMENT: 68 yo female admitted for lumbar surgery on 7/9.   Since admission, has been eating poorly. Morbidly obese with BMI = 41.85. Needs adequate protein and calorie intake to promote healing and recovery.  Nutrition focused physical exam completed.  No muscle or subcutaneous fat depletion noticed.   Height: Ht Readings from Last 1 Encounters:  02/25/14 5' 1.75" (1.568 m)    Weight: Wt Readings from Last 1 Encounters:  02/25/14 226 lb 13.7 oz (102.9 kg)    Ideal Body Weight: 49.4 kg  % Ideal Body Weight: 208%  Wt Readings from Last 10 Encounters:  02/25/14 226 lb 13.7 oz (102.9 kg)  02/25/14 226 lb 13.7 oz (102.9 kg)  02/24/14 230 lb (104.327 kg)  02/11/14 233 lb 6.4 oz (105.87 kg)  09/16/13 234 lb (106.142 kg)  09/14/13 230 lb (104.327 kg)  08/28/13 230 lb (104.327 kg)  08/21/13 230 lb (104.327 kg)  08/14/13 232 lb (105.235 kg)  07/03/13 232 lb 9.6 oz (105.507 kg)    Usual Body Weight: 230 lb  % Usual Body Weight: 98%  BMI:  Body mass index is 41.85 kg/(m^2). class 3, extreme/morbid obesity  Estimated Nutritional Needs: Kcal: 1800-2000 Protein: 100-120 gm Fluid: 2 L  Skin: abdominal incision, back incision  Diet Order: Carb Control  EDUCATION NEEDS: -Education not appropriate at this time   Intake/Output Summary (Last 24 hours) at 03/01/14 1434 Last data filed at 03/01/14 1300  Gross per 24 hour  Intake   2270 ml  Output   2775 ml  Net   -505 ml     Last BM: 7/8   Labs:   Recent Labs Lab 02/28/14 1806 03/01/14 0545  NA 131* 134*  K 4.3 3.9  CL 88* 94*  CO2 28 28  BUN 21 18  CREATININE 1.23* 0.86  CALCIUM 8.3* 8.2*  GLUCOSE 105* 99    CBG (last 3)   Recent Labs  02/28/14 1517 02/28/14 2136 03/01/14 0822  GLUCAP 101* 106* 92    Scheduled Meds: . cefTRIAXone (ROCEPHIN)  IV  1 g Intravenous Daily  . docusate sodium  100 mg Oral BID  . fluticasone  2 spray Each Nare QHS  . insulin aspart  0-15 Units Subcutaneous TID WC  . insulin aspart  0-5 Units Subcutaneous QHS  . insulin aspart  4 Units Subcutaneous TID WC  . levothyroxine  150 mcg Oral QAC breakfast  . pantoprazole  40 mg Oral QHS  . sertraline  100 mg Oral Daily  . sodium chloride  3 mL Intravenous Q12H    Continuous Infusions: . 0.45 % NaCl with KCl 20 mEq / L 75 mL/hr at 02/28/14 0539  . sodium chloride Stopped (02/25/14 2245)    Past Medical History  Diagnosis Date  . Hypertension   . Hyperlipidemia   . Depression   . Diabetes mellitus   . IBS (irritable bowel syndrome)   . Basal cell carcinoma of skin   . Coronary artery disease   . Dysrhythmia   .  Pneumonia     early this year  . H/O hiatal hernia     otc  . GERD (gastroesophageal reflux disease)   . Arthritis   . Hypothyroidism     Past Surgical History  Procedure Laterality Date  . Cholecystectomy    . Cesarean section    . Lacrimal duct probing    . Basal cell carcinoma excision    . Thyroidectomy, partial    . Dilation and curettage of uterus    . Dental surgery      will have dental surgery 02/24/14, Root canal  . Root canal  02-24-14    Molli Barrows, RD, LDN, [place] Pager [phone] After Hours Pager [phone]

## 2014-03-02 ENCOUNTER — Encounter (HOSPITAL_COMMUNITY): Payer: Self-pay | Admitting: Neurosurgery

## 2014-03-02 DIAGNOSIS — E871 Hypo-osmolality and hyponatremia: Secondary | ICD-10-CM

## 2014-03-02 DIAGNOSIS — I9589 Other hypotension: Secondary | ICD-10-CM

## 2014-03-02 LAB — BASIC METABOLIC PANEL
Anion gap: 12 (ref 5–15)
BUN: 16 mg/dL (ref 6–23)
CALCIUM: 8.5 mg/dL (ref 8.4–10.5)
CO2: 28 meq/L (ref 19–32)
CREATININE: 0.81 mg/dL (ref 0.50–1.10)
Chloride: 96 mEq/L (ref 96–112)
GFR calc Af Amer: 84 mL/min — ABNORMAL LOW (ref >90)
GFR, EST NON AFRICAN AMERICAN: 72 mL/min — AB (ref >90)
GLUCOSE: 126 mg/dL — AB (ref 70–99)
Potassium: 4.1 mEq/L (ref 3.7–5.3)
Sodium: 136 mEq/L — ABNORMAL LOW (ref 137–147)

## 2014-03-02 LAB — GLUCOSE, CAPILLARY
Glucose-Capillary: 137 mg/dL — ABNORMAL HIGH (ref 70–99)
Glucose-Capillary: 116 mg/dL — ABNORMAL HIGH (ref 70–99)
Glucose-Capillary: 163 mg/dL — ABNORMAL HIGH (ref 70–99)
Glucose-Capillary: 134 mg/dL — ABNORMAL HIGH (ref 70–99)
Glucose-Capillary: 128 mg/dL — ABNORMAL HIGH (ref 70–99)

## 2014-03-02 LAB — CBC
HCT: 27.7 % — ABNORMAL LOW (ref 36.0–46.0)
Hemoglobin: 9.4 g/dL — ABNORMAL LOW (ref 12.0–15.0)
MCH: 31.5 pg (ref 26.0–34.0)
MCHC: 33.9 g/dL (ref 30.0–36.0)
MCV: 93 fL (ref 78.0–100.0)
PLATELETS: 212 10*3/uL (ref 150–400)
RBC: 2.98 MIL/uL — ABNORMAL LOW (ref 3.87–5.11)
RDW: 12.2 % (ref 11.5–15.5)
WBC: 8.8 10*3/uL (ref 4.0–10.5)

## 2014-03-02 MED ORDER — METFORMIN HCL ER 500 MG PO TB24
500.0000 mg | ORAL_TABLET | Freq: Two times a day (BID) | ORAL | Status: DC
  Administered 2014-03-03 – 2014-03-04 (×3): 500 mg via ORAL
  Filled 2014-03-02 (×5): qty 1

## 2014-03-02 NOTE — Clinical Social Work Placement (Signed)
Clinical Social Work Department CLINICAL SOCIAL WORK PLACEMENT NOTE 03/02/2014  Patient:  JERICCA, RUSSETT  Account Number:  [id number] Admit date:  02/25/2014  Clinical Social Worker:  Lovey Newcomer  Date/time:  03/02/2014 10:32 AM  Clinical Social Work is seeking post-discharge placement for this patient at the following level of care:   SKILLED NURSING   (*CSW will update this form in Epic as items are completed)   03/02/2014  Patient/family provided with [place] Department of Clinical Social Work's list of facilities offering this level of care within the geographic area requested by the patient (or if unable, by the patient's family).  03/02/2014  Patient/family informed of their freedom to choose among providers that offer the needed level of care, that participate in Medicare, Medicaid or managed care program needed by the patient, have an available bed and are willing to accept the patient.  03/02/2014  Patient/family informed of [facility]' ownership interest in [facility], as well as of the fact that they are under no obligation to receive care at this facility.  PASARR submitted to EDS on 03/02/2014 PASARR number received on 03/02/2014  FL2 transmitted to all facilities in geographic area requested by pt/family on  03/02/2014 FL2 transmitted to all facilities within larger geographic area on   Patient informed that his/her managed care company has contracts with or will negotiate with  certain facilities, including the following:     Patient/family informed of bed offers received:   Patient chooses bed at  Physician recommends and patient chooses bed at    Patient to be transferred to  on   Patient to be transferred to facility by  Patient and family notified of transfer on  Name of family member notified:    The following physician request were entered in Epic:   Additional Comments:    Liz Beach MSW, [place], [place],  [phone]

## 2014-03-02 NOTE — Progress Notes (Signed)
Patient ID: Alexis King, female   DOB: Jun 22, 1946, 68 y.o.   MRN: [id number] Afeb, vss No new neuro issues Feeling steadily better. She has some cognitive and judgement issues, and I feel that she would benefit from an intermediate stay for a few weeks at a SNF rather than sending her directly home. Will start to explore those options.

## 2014-03-02 NOTE — Clinical Social Work Psychosocial (Signed)
Clinical Social Work Department BRIEF PSYCHOSOCIAL ASSESSMENT 03/02/2014  Patient:  Alexis King, Alexis King     Account Number:  [id number]     Admit date:  02/25/2014  Clinical Social Worker:  Lovey Newcomer  Date/Time:  03/02/2014 10:27 AM  Referred by:  Physician  Date Referred:  03/02/2014 Referred for  SNF Placement   Other Referral:   Interview type:  Patient Other interview type:   Patient alert and oriented at time of assessment.    PSYCHOSOCIAL DATA Living Status:  HUSBAND Admitted from facility:   Level of care:   Primary support name:  Broadus John and [employer] Primary support relationship to patient:  FAMILY Degree of support available:   Support is good.    CURRENT CONCERNS Current Concerns  Post-Acute Placement   Other Concerns:    SOCIAL WORK ASSESSMENT / PLAN CSW met with patient at bedside to complete assessment. Patient is agreeable to SNF placement as backup to CIR admission. Patient states that she has a preference for [employer] [facility] and [employer]. Patient reports that she lives with her disabled husband at home and reports that he is not able to provide the care she needs at home. CSW explained SNF search/placement process to patient and answered questions. CSW will follow up with bed offers.   Assessment/plan status:  Psychosocial Support/Ongoing Assessment of Needs Other assessment/ plan:   Complete Fl2, Fax, PASRR   Information/referral to community resources:   CSW contact information and SNF list given.    PATIENT'S/FAMILY'S RESPONSE TO PLAN OF CARE: Patient is agreeable to SNF placement if necessary. Patient was pleasant and engaged in assessment. CSW will assist.         Liz Beach MSW, [place], [place], [phone]

## 2014-03-02 NOTE — Progress Notes (Signed)
Received prescreen request for inpatient rehab and have reviewed pt's case. Pt has [employer] and based on current diagnosis, it is unlikely that inpatient rehab would be approved.   We can pursue inpatient rehab consult if team feels necessary but likely would recommend skilled nursing facility be pursued in light of pt's insurance/current diagnosis.  Thank you.  Nanetta Batty, PT Rehabilitation Admissions Coordinator [phone]

## 2014-03-02 NOTE — Care Management Note (Signed)
    Page 1 of 2   03/04/2014     10:59:24 AM CARE MANAGEMENT NOTE 03/04/2014  Patient:  Alexis King, Alexis King   Account Number:  [id number]  Date Initiated:  02/26/2014  Documentation initiated by:  Marvetta Gibbons  Subjective/Objective Assessment:   Pt admitted s/p L2-3 L3-4 and L4-5.  three-level lateral fusion     Action/Plan:   PTA pt lived at home with spouse- PT eval ordered and pending- NCM to follow for recommendations   Anticipated DC Date:  03/04/2014   Anticipated DC Plan:  [place]  In-house referral  Clinical Social Worker      DC Planning Services  CM consult      Choice offered to / List presented to:             Status of service:  Completed, signed off Medicare Important Message given?  YES (If response is "NO", the following Medicare IM given date fields will be blank) Date Medicare IM given:  03/02/2014 Medicare IM given by:  Marvetta Gibbons Date Additional Medicare IM given:  03/04/2014 Additional Medicare IM given by:  Lenka Zhao GRAVES-BIGELOW  Discharge Disposition:  [place]  Per UR Regulation:  Reviewed for med. necessity/level of care/duration of stay  If discussed at [place] of Stay Meetings, dates discussed:   03/02/2014  03/04/2014    Comments:  03-03-14 [address] RN,BSN [phone] Pt continues on IV Rocephin for UTI. Per MD notes BP stable. Plan is for SNF once medically stable for d/c. CSW to assist with disposition needs.   03/02/14- [place] RN, BSN [phone] PT/OT now recommending CIR- spoke with Pamala Hurry with CIR who states that pt's insurance will not approve CIR stay based on diagnosis- will approve SNF for rehab- CSW consulted for placement needs- spoke with pt at bedside- who is agreeable to [facility] for rehab - copy of mailed Medicare IM given to pt.- no signature obtained due to pt being on isolation   03/01/14- [place] RN , BSN [phone] Pt with soft BP over weekend  received fluid bolus, also still with pain issues- PT recommending [place] services- NCM to follow for d/c planning.

## 2014-03-02 NOTE — Progress Notes (Signed)
Alexis King  Alexis King TKZ:[id number] DOB: 1946/01/25 DOA: 02/25/2014 PCP: Leandrew Koyanagi, MD  Brief narrative: 68 y.o. Female with a history of hypertension, diabetes mellitus and hypothyroidism. Had lumbar surgery on 02/25/2014. She was noted to have progressive confusion, hypotension and decreased urine output on 02/28/2014. Upon exam she was documented as oriented to name and place and was without focal neuro deficits  Patient was initially hypotensive with blood pressure in the 57'D systolic which improved with IV boluses. After the fluid challenges her BP rebounded to 220 systolic. Patient had abdominal discomfort which was reproducible with exam. There was no drainage from the incision site in the right flank area. Patient denied any chest pain or shortness of breath and did not have any nausea or vomiting. Labs revealed only mild elevation of creatinine and mildly lower hemoglobin from baseline. She had received 2 doses of IV morphine prior to onset of symptoms.  HPI/Subjective: No new symptoms-frustrated that her insurance does not cover CIR  Assessment/Plan:    Hypotension -resolved after hydration and dc of offending meds (diuretics and anti HTNs) ** Will sign off- please call back if any problems    Dehydration with hyponatremia -cont IVFs - Na increasing with hydration -urine sodium unreliable in setting of recent use of diuretics    Abnormal urinalysis -appears c/w UTI -Follow up on urine culture -cont Rocephin-needs total 7 days antibiotics for CAUTI -recommend dc foley if no post op indications to continue -would repeat UA after antibiotics complete   Abdominal pain -resolved-CT unrevealing-likey related to UTI    Acute respiratory failure with hypoxia -seems mediated by hypoventilation during hypotension and encephalopathy -CXR unremarkable    HTN  -BP soft so cont to hold home meds -monitor for breakthru  tachycardia since was on Coreg pre admit -when resume would begin Coreg first then add back other meds 1 at a time until all have been resumed and no resultant hypotension    DM  -CBGs controlled -cont SSI and meal coverage -resume home Metformin in am-order placed    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 rate 21, height 5' 1.75" (1.568 m), weight 226 lb 13.7 oz (102.9 kg), SpO2 100.00%.  Intake/Output Summary (Last 24 hours) at 03/02/14 1330 Last data filed at 03/02/14 0800  Gross per 24 hour  Intake   1050 ml  Output   1700 ml  Net   -650 ml    Exam: General: No acute respiratory distress-more awake today than as described at time of initial consultation yesterday evening Lungs: Clear to auscultation bilaterally without wheezes or crackles, 2L Cardiovascular: Regular rate and rhythm without murmur gallop or rub normal S1 and S2, no peripheral edema  Abdomen: Nontender, nondistended, soft, bowel sounds positive, no rebound, no ascites, no appreciable mass Musculoskeletal: No significant cyanosis, clubbing of bilateral lower extremities   Scheduled Meds:  Scheduled Meds: . cefTRIAXone (ROCEPHIN)  IV  1 g Intravenous Daily  .  docusate sodium  100 mg Oral BID  . feeding supplement (GLUCERNA SHAKE)  237 mL Oral TID BM  . fluticasone  2 spray Each Nare QHS  . insulin aspart  0-15 Units  Subcutaneous TID WC  . insulin aspart  0-5 Units Subcutaneous QHS  . insulin aspart  4 Units Subcutaneous TID WC  . levothyroxine  150 mcg Oral QAC breakfast  . pantoprazole  40 mg Oral QHS  . sertraline  100 mg Oral Daily    Data Reviewed: Basic Metabolic Panel:  Recent Labs Lab 02/28/14 1806 03/01/14 0545 03/02/14 0437  NA 131* 134* 136*  K 4.3 3.9 4.1  CL 88* 94* 96  CO2 28 28 28   GLUCOSE 105* 99 126*  BUN 21 18 16   CREATININE 1.23* 0.86 0.81  CALCIUM 8.3* 8.2* 8.5   Liver Function Tests:  Recent Labs Lab 02/28/14 2150 03/01/14 0545  AST 29 30  ALT 25 24  ALKPHOS 62 62  BILITOT 0.6 0.6  PROT 6.4 6.4  ALBUMIN 2.7* 2.6*    Recent Labs Lab 02/28/14 2150  AMMONIA 10*   CBC:  Recent Labs Lab 02/28/14 1806 03/01/14 0545 03/02/14 0437  WBC 13.4* 11.5* 8.8  NEUTROABS 10.8* 9.1*  --   HGB 10.3* 9.8* 9.4*  HCT 29.7* 28.2* 27.7*  MCV 93.4 92.8 93.0  PLT 206 199 212   Cardiac Enzymes:  Recent Labs Lab 02/28/14 2150  CKTOTAL 667*  TROPONINI <0.30   CBG:  Recent Labs Lab 03/01/14 1602 03/01/14 2124 03/02/14 0252 03/02/14 0756 03/02/14 1148  GLUCAP 139* 172* 137* 116* 163*    Studies:  Recent x-ray studies have been reviewed in detail by the Attending Physician  Time spent :  35 mins  Erin Hearing, ANP [facility] Office  [phone] Pager [phone]  **If unable to reach the above provider after paging please contact the [place] @ [phone]  On-Call/Text Page:      [email or web address]      password [facility]  03/02/2014, 1:30 PM   LOS: 5 days   I have examined the patient, reviewed the chart and modified the above King which I agree with.   Fumio Vandam,MD Pager # on [email or web address] 03/02/2014, 5:26 PM

## 2014-03-03 LAB — GLUCOSE, CAPILLARY
GLUCOSE-CAPILLARY: 142 mg/dL — AB (ref 70–99)
Glucose-Capillary: 109 mg/dL — ABNORMAL HIGH (ref 70–99)
Glucose-Capillary: 110 mg/dL — ABNORMAL HIGH (ref 70–99)
Glucose-Capillary: 153 mg/dL — ABNORMAL HIGH (ref 70–99)

## 2014-03-03 LAB — URINE CULTURE: Colony Count: >=100000

## 2014-03-03 NOTE — Clinical Social Work Placement (Signed)
Clinical Social Work Department CLINICAL SOCIAL WORK PLACEMENT NOTE 03/03/2014  Patient:  Alexis King, Alexis King  Account Number:  [id number] Admit date:  02/25/2014  Clinical Social Worker:  Lovey Newcomer  Date/time:  03/02/2014 10:32 AM  Clinical Social Work is seeking post-discharge placement for this patient at the following level of care:   SKILLED NURSING   (*CSW will update this form in Epic as items are completed)   03/02/2014  Patient/family provided with [place] Department of Clinical Social Work's list of facilities offering this level of care within the geographic area requested by the patient (or if unable, by the patient's family).  03/02/2014  Patient/family informed of their freedom to choose among providers that offer the needed level of care, that participate in Medicare, Medicaid or managed care program needed by the patient, have an available bed and are willing to accept the patient.  03/02/2014  Patient/family informed of [facility]' ownership interest in [facility], as well as of the fact that they are under no obligation to receive care at this facility.  PASARR submitted to EDS on 03/02/2014 PASARR number received on 03/02/2014  FL2 transmitted to all facilities in geographic area requested by pt/family on  03/02/2014 FL2 transmitted to all facilities within larger geographic area on   Patient informed that his/her managed care company has contracts with or will negotiate with  certain facilities, including the following:     Patient/family informed of bed offers received:  03/03/2014 Patient chooses bed at  Physician recommends and patient chooses bed at    Patient to be transferred to  on   Patient to be transferred to facility by  Patient and family notified of transfer on  Name of family member notified:    The following physician request were entered in Epic:   Additional Comments:   Liz Beach MSW, [place],  [place], [phone]

## 2014-03-03 NOTE — Progress Notes (Signed)
Patient ID: Alexis King, female   DOB: Apr 15, 1946, 68 y.o.   MRN: [id number] Afeb, vss. No new neuro issues. Looks progressively better. She should be ready for d/c in next day or so if medical service feels she is reasonably stable. It appears that a SNF will be her next destination.

## 2014-03-03 NOTE — Progress Notes (Signed)
Pt refused to wear back brace when ambulating to [facility].

## 2014-03-03 NOTE — Progress Notes (Signed)
PT Cancellation Note  Patient Details Name: Alexis King MRN: [id number] DOB: 1946/08/12   Cancelled Treatment:    Reason Eval/Treat Not Completed: Other (comment) Patient with hospital staff visitors at this time, will follow up as time permits.    Duncan Dull 03/03/2014, 2:39 PM

## 2014-03-03 NOTE — Progress Notes (Signed)
CARE MANAGEMENT NOTE 03/03/2014  Patient:  Alexis King, Alexis King   Account Number:  [id number]  Date Initiated:  02/26/2014  Documentation initiated by:  Marvetta Gibbons  Subjective/Objective Assessment:   Pt admitted s/p L2-3 L3-4 and L4-5.  three-level lateral fusion     Action/Plan:   PTA pt lived at home with spouse- PT eval ordered and pending- NCM to follow for recommendations   Anticipated DC Date:  03/04/2014   Anticipated DC Plan:  [place]  In-house referral  Clinical Social Worker      DC Planning Services  CM consult      Choice offered to / List presented to:             Status of service:  In process, will continue to follow Medicare Important Message given?  YES (If response is "NO", the following Medicare IM given date fields will be blank) Date Medicare IM given:  03/02/2014 Medicare IM given by:  Marvetta Gibbons Date Additional Medicare IM given:   Additional Medicare IM given by:    Discharge Disposition:    Per UR Regulation:  Reviewed for med. necessity/level of care/duration of stay  If discussed at [place] of Stay Meetings, dates discussed:   03/02/2014  03/04/2014    Comments:   03-03-14 [address], RN,BSN [phone] Pt continues on IV Rocephin for UTI. Per MD notes BP stable. Plan is for SNF once medically stable for d/c. CSW to assist with disposition needs.   03/02/14- [place] RN, BSN [phone] PT/OT now recommending CIR- spoke with Pamala Hurry with CIR who states that pt's insurance will not approve CIR stay based on diagnosis- will approve SNF for rehab- CSW consulted for placement needs- spoke with pt at bedside- who is agreeable to [facility] for rehab - copy of mailed Medicare IM given to pt.- no signature obtained due to pt being on isolation   03/01/14- [place] RN , BSN [phone] Pt with soft BP over weekend received fluid bolus, also still with pain issues- PT recommending [place] services- NCM to  follow for d/c planning.

## 2014-03-04 LAB — GLUCOSE, CAPILLARY
GLUCOSE-CAPILLARY: 133 mg/dL — AB (ref 70–99)
Glucose-Capillary: 204 mg/dL — ABNORMAL HIGH (ref 70–99)

## 2014-03-04 MED ORDER — CYCLOBENZAPRINE HCL 10 MG PO TABS
10.0000 mg | ORAL_TABLET | Freq: Three times a day (TID) | ORAL | Status: DC | PRN
  Administered 2014-03-04: 10 mg via ORAL
  Filled 2014-03-04: qty 1

## 2014-03-04 MED ORDER — HYDROMORPHONE HCL 4 MG PO TABS: 4.0000 mg | ORAL_TABLET | ORAL | Status: DC | PRN

## 2014-03-04 NOTE — Progress Notes (Signed)
Pt report called to [facility]. Will continue to monitor.

## 2014-03-04 NOTE — Discharge Summary (Signed)
Physician Discharge Summary  Patient ID: Alexis King MRN: [id number] DOB/AGE: May 18, 1946 68 y.o.  Admit date: 02/25/2014 Discharge date: 03/04/2014  Admission Diagnoses:  Discharge Diagnoses:  Active Problems:   HTN (hypertension)   BMI 40.0-44.9, adult   Unspecified hypothyroidism   DM (diabetes mellitus)   Spondylolisthesis of lumbar region   Hypotension   Dehydration with hyponatremia   Acute respiratory failure with hypoxia   Discharged Condition: good  Hospital Course: Surgery one week ago for multi level spondylosis, with listhesis and stenosis. Did fairly well, though on pod 4, had some episodic confusion. Hosptialist service consulted and helped with her care. Much better by the next day. Then increased activity steadily. By pod 7, felt ready for d/c, but felt she needed some supervised rehab, and sent to SNF for short period of time. Wounds all healing well at that time.  Consults: Hospitalist service  Significant Diagnostic Studies: none  Treatments: L 23 L 34 xlif; L 45 tlif; pedicle screws  Discharge Exam: Blood pressure 115/48, pulse 71, temperature 98 F (36.7 C), temperature source Oral, resp. rate 19, height 5' 1.75" (1.568 m), weight 102.9 kg (226 lb 13.7 oz), SpO2 94.00%. Incision/Wound:all healing well; no new neuro issues; walking without difficulty with aid of a walker  Disposition:      Medication List    ASK your doctor about these medications       amLODipine 10 MG tablet  Commonly known as:  NORVASC  Take 5 mg by mouth daily.     aspirin EC 81 MG tablet  Take 81 mg by mouth daily.     atorvastatin 40 MG tablet  Commonly known as:  LIPITOR  Take 40 mg by mouth at bedtime.     Biotin 10 MG Tabs  Take 10 mg by mouth daily.     buPROPion 300 MG 24 hr tablet  Commonly known as:  WELLBUTRIN XL  Take 1 tablet by mouth  daily (patient needs office for additional refills)     CALCIUM MAGNESIUM PO  Take 1 capsule by mouth daily.     carvedilol 6.25 MG tablet  Commonly known as:  COREG  Take 6.25 mg by mouth 2 (two) times daily with a meal.     cholecalciferol 1000 UNITS tablet  Commonly known as:  VITAMIN D  Take 1,000 Units by mouth daily.     clonazePAM 0.5 MG tablet  Commonly known as:  KLONOPIN  Take 0.5 mg by mouth at bedtime as needed (sleep).     fluticasone 50 MCG/ACT nasal spray  Commonly known as:  FLONASE  Place 2 sprays into both nostrils at bedtime.     HYDROcodone-acetaminophen 5-325 MG per tablet  Commonly known as:  NORCO/VICODIN  Take 1 tablet by mouth every 6 (six) hours as needed for moderate pain.     Krill Oil 500 MG Caps  Take 500 mg by mouth daily.     levothyroxine 150 MCG tablet  Commonly known as:  SYNTHROID, LEVOTHROID  Take 1 tablet (150 mcg total) by mouth daily.     losartan-hydrochlorothiazide 100-12.5 MG per tablet  Commonly known as:  HYZAAR  Take 1 tablet by mouth daily.     metFORMIN 500 MG 24 hr tablet  Commonly known as:  GLUCOPHAGE-XR  Take 1 tablet (500 mg total) by mouth 2 (two) times daily.     minoxidil 2 % external solution  Commonly known as:  ROGAINE  Apply 1 application topically at bedtime.  multivitamin with minerals tablet  Take 1 tablet by mouth daily.     naproxen sodium 220 MG tablet  Commonly known as:  ANAPROX  Take 220-440 mg by mouth 2 (two) times daily with a meal. 220mg  in the morning and 440 at bedtime.     sertraline 100 MG tablet  Commonly known as:  ZOLOFT  Take 100 mg by mouth daily.     spironolactone 25 MG tablet  Commonly known as:  ALDACTONE  Take 25 mg by mouth daily.     SYSTANE OP  Place 1 drop into both eyes daily as needed (dry eyes).     vitamin C 500 MG tablet  Commonly known as:  ASCORBIC ACID  Take 500 mg by mouth daily.         At home rest most of the time. Get up 9 or 10 times each day and take a 15 or 20 minute walk. No riding in the car and to your first postoperative appointment. If you have neck  surgery you may shower from the chest down starting on the third postoperative day. If you had back surgery he may start showering on the third postoperative day with saran wrap wrapped around your incisional area 3 times. After the shower remove the saran wrap. Take pain medicine as needed and other medications as instructed. Call my office for an appointment.  SignedFaythe Ghee, MD 03/04/2014, 3:08 PM

## 2014-03-04 NOTE — Clinical Social Work Note (Signed)
Patient has chosen bed at [place]. Patient can be admitted to SNF today if MD decides to discharge patient. FL2 on chart for MD signature.  Liz Beach MSW, [place], [place], [phone]

## 2014-03-04 NOTE — Clinical Social Work Placement (Signed)
Clinical Social Work Department CLINICAL SOCIAL WORK PLACEMENT NOTE 03/04/2014  Patient:  Alexis King, Alexis King  Account Number:  [id number] Admit date:  02/25/2014  Clinical Social Worker:  Lovey Newcomer  Date/time:  03/02/2014 10:32 AM  Clinical Social Work is seeking post-discharge placement for this patient at the following level of care:   SKILLED NURSING   (*CSW will update this form in Epic as items are completed)   03/02/2014  Patient/family provided with [place] Department of Clinical Social Work's list of facilities offering this level of care within the geographic area requested by the patient (or if unable, by the patient's family).  03/02/2014  Patient/family informed of their freedom to choose among providers that offer the needed level of care, that participate in Medicare, Medicaid or managed care program needed by the patient, have an available bed and are willing to accept the patient.  03/02/2014  Patient/family informed of [facility]' ownership interest in [facility], as well as of the fact that they are under no obligation to receive care at this facility.  PASARR submitted to EDS on 03/02/2014 PASARR number received on 03/02/2014  FL2 transmitted to all facilities in geographic area requested by pt/family on  03/02/2014 FL2 transmitted to all facilities within larger geographic area on   Patient informed that his/her managed care company has contracts with or will negotiate with  certain facilities, including the following:     Patient/family informed of bed offers received:  03/03/2014 Patient chooses bed at [place] Physician recommends and patient chooses bed at    Patient to be transferred to [place] on  03/04/2014 Patient to be transferred to facility by Ambulance Patient and family notified of transfer on 03/04/2014 Name of family member notified:  Broadus John  The following  physician request were entered in Epic:   Additional Comments: Per Md patient ready for DC to [facility]. RN, patient, patient's husband, and facility aware of DC. RN given number for report. DC packet on chart. Ambulance transport requested. CSW signing off at this time.    Liz Beach MSW, [place], [place], [phone]

## 2014-03-04 NOTE — Progress Notes (Signed)
Physical Therapy Treatment Patient Details Name: Alexis King MRN: [id number] DOB: 1945-09-20 Today's Date: 03/04/2014    History of Present Illness Pt admitted for lumbar fusion with ALIF L1-3 and TLIF L3-5. pt with root canal 02/24/14    PT Comments    Patient progressing well today, able to tolerate self care activities, as well as increased ambulation with 2 rest breaks. Current POC appropriate will continue to see as indicated.  Follow Up Recommendations  CIR     Equipment Recommendations  Rolling walker with 5" wheels;3in1 (PT)    Recommendations for Other Services       Precautions / Restrictions Precautions Precautions: Back;Fall Precaution Comments: Reviewed 3/3 back precautions. Required Braces or Orthoses: Spinal Brace Spinal Brace: Lumbar corset;Applied in sitting position Restrictions Weight Bearing Restrictions: No    Mobility  Bed Mobility Overal bed mobility: Needs Assistance Bed Mobility: Rolling;Sidelying to Sit Rolling: Min assist Sidelying to sit: Min assist       General bed mobility comments: verbal cues for sequencing, precautions  Transfers Overall transfer level: Needs assistance Equipment used: Rolling walker (2 wheeled) Transfers: Sit to/from Stand Sit to Stand: Min guard         General transfer comment: performed from bed, chair and commode, VCs for hand placement and safety with poor carry over with each transfer  Ambulation/Gait Ambulation/Gait assistance: Min assist Ambulation Distance (Feet): 180 Feet Assistive device: Rolling walker (2 wheeled) Gait Pattern/deviations: Step-through pattern;Decreased stride length;Trunk flexed;Wide base of support Gait velocity: decreased   General Gait Details: VCs for upright posture and increased cadence   Stairs            Wheelchair Mobility    Modified Rankin (Stroke Patients Only)       Balance     Sitting balance-Leahy Scale: Fair       Standing  balance-Leahy Scale: Fair                      Cognition Arousal/Alertness: Awake/alert Behavior During Therapy: WFL for tasks assessed/performed Overall Cognitive Status: Within Functional Limits for tasks assessed                      Exercises      General Comments General comments (skin integrity, edema, etc.): performed hygiene and pericare with assist for pericare secondary to precautions      Pertinent Vitals/Pain No pain reported today, VSS    Home Living                      Prior Function            PT Goals (current goals can now be found in the care plan section) Acute Rehab PT Goals Patient Stated Goal: to be able to walk and get back home PT Goal Formulation: With patient Time For Goal Achievement: 03/12/14 Potential to Achieve Goals: Good Progress towards PT goals: Progressing toward goals    Frequency  Min 5X/week    PT Plan Discharge plan needs to be updated    Co-evaluation             End of Session Equipment Utilized During Treatment: Back brace;Gait belt Activity Tolerance: Patient limited by pain Patient left: in chair;with call bell/phone within reach     Time: 0254-2706 PT Time Calculation (min): 26 min  Charges:  $Gait Training: 8-22 mins $Therapeutic Activity: 8-22 mins  G CodesDuncan Dull Mar 30, 2014, 12:25 PM Alben Deeds, [place] DPT  [phone]

## 2014-03-04 NOTE — Progress Notes (Signed)
Patient and family aware of discharge plans to [facility]. CL removed no signs of bleeding or distress. Dr. Hal Neer removed gauze and honeycomb dressings- steri strips still in place. No signs of bleeding or infection around the surgical sites.  Belongs packed, discharge summary and prescription enclosed in manila envelope to be sent with  [facility].

## 2014-03-05 ENCOUNTER — Other Ambulatory Visit: Payer: Self-pay | Admitting: *Deleted

## 2014-03-05 MED ORDER — CLONAZEPAM 0.5 MG PO TABS: ORAL_TABLET | ORAL | Status: DC

## 2014-03-05 MED ORDER — HYDROCODONE-ACETAMINOPHEN 5-325 MG PO TABS: ORAL_TABLET | ORAL | Status: DC

## 2014-03-05 NOTE — Telephone Encounter (Signed)
[facility] 

## 2014-03-08 ENCOUNTER — Encounter: Payer: Self-pay | Admitting: Internal Medicine

## 2014-03-08 ENCOUNTER — Non-Acute Institutional Stay (SKILLED_NURSING_FACILITY): Payer: Medicare Other | Admitting: Internal Medicine

## 2014-03-08 DIAGNOSIS — E1329 Other specified diabetes mellitus with other diabetic kidney complication: Secondary | ICD-10-CM

## 2014-03-08 DIAGNOSIS — N058 Unspecified nephritic syndrome with other morphologic changes: Secondary | ICD-10-CM

## 2014-03-08 DIAGNOSIS — E039 Hypothyroidism, unspecified: Secondary | ICD-10-CM

## 2014-03-08 DIAGNOSIS — I10 Essential (primary) hypertension: Secondary | ICD-10-CM

## 2014-03-08 DIAGNOSIS — I951 Orthostatic hypotension: Secondary | ICD-10-CM

## 2014-03-08 DIAGNOSIS — M4316 Spondylolisthesis, lumbar region: Secondary | ICD-10-CM

## 2014-03-08 DIAGNOSIS — E0821 Diabetes mellitus due to underlying condition with diabetic nephropathy: Secondary | ICD-10-CM

## 2014-03-08 DIAGNOSIS — M4310 Spondylolisthesis, site unspecified: Secondary | ICD-10-CM

## 2014-03-08 DIAGNOSIS — J9601 Acute respiratory failure with hypoxia: Secondary | ICD-10-CM

## 2014-03-08 DIAGNOSIS — J9600 Acute respiratory failure, unspecified whether with hypoxia or hypercapnia: Secondary | ICD-10-CM

## 2014-03-08 NOTE — Progress Notes (Signed)
MRN: [id number] Name: Alexis King  Sex: female Age: 68 y.o. DOB: [birth year]  [place] #: [facility] Facility/Room: 119A Level Of Care: SNF Provider: Inocencio Homes D Emergency Contacts: Extended Emergency Contact Information Primary Emergency Contact: Heiland,Amy Address: [address]          [place], [place] [address] [place] Phone: [phone] Relation: Daughter Secondary Emergency Contact: Ford,Joseph Address: [place], [place] [address] [place] Phone: [phone] Mobile Phone: [phone] Relation: Spouse  Code Status: DNR  Allergies: Ace inhibitors; Clindamycin/lincomycin; Levaquin; Meloxicam; and Sulfa antibiotics  Chief Complaint  Patient presents with  . nursing home admission    HPI: Patient is 68 y.o. female who has lower back surgery and who is admitted for OT/PT.  Past Medical History  Diagnosis Date  . Hypertension   . Hyperlipidemia   . Depression   . Diabetes mellitus   . IBS (irritable bowel syndrome)   . Basal cell carcinoma of skin   . Coronary artery disease   . Dysrhythmia   . Pneumonia     early this year  . H/O hiatal hernia     otc  . GERD (gastroesophageal reflux disease)   . Arthritis   . Hypothyroidism     Past Surgical History  Procedure Laterality Date  . Cholecystectomy    . Cesarean section    . Lacrimal duct probing    . Basal cell carcinoma excision    . Thyroidectomy, partial    . Dilation and curettage of uterus    . Dental surgery      will have dental surgery 02/24/14, Root canal  . Root canal  02-24-14  . Anterior lat lumbar fusion N/A 02/25/2014    Procedure: ANTERIOR LATERAL LUMBAR FUSION 3 LEVELS;  Surgeon: Faythe Ghee, MD;  Location: [facility];  Service: Neurosurgery;  Laterality: N/A;  Lumbar One-Three Lateral Interbody  . Lumbar percutaneous pedicle screw 3 level  02/25/2014    Procedure: LUMBAR PERCUTANEOUS PEDICLE SCREW LUMBAR  TWO-THREE,LUMBAR THREE-FOUR,LUMBAR FOUR-FIVE;  Surgeon: Faythe Ghee, MD;  Location: [facility];  Service: Neurosurgery;;  . Transforaminal lumbar interbody fusion (tlif) with pedicle screw fixation 1 level  02/25/2014    Procedure: TRANSFORAMINAL LUMBAR INTERBODY FUSION (TLIF) WITH PEDICLE SCREW FIXATION 1 LEVEL;  Surgeon: Faythe Ghee, MD;  Location: [facility];  Service: Neurosurgery;;  Transforaminal Lumbar Four-Five Interbody      Medication List       This list is accurate as of: 03/08/14 11:59 PM.  Always use your most recent med list.               amLODipine 10 MG tablet  Commonly known as:  NORVASC  Take 5 mg by mouth daily.     atorvastatin 40 MG tablet  Commonly known as:  LIPITOR  Take 40 mg by mouth at bedtime.     Biotin 10 MG Caps  Take 1 tablet by mouth daily.     buPROPion 300 MG 24 hr tablet  Commonly known as:  WELLBUTRIN XL  Take 1 tablet by mouth  daily (patient needs office for additional refills)     CALCIUM MAGNESIUM PO  Take 1 tablet by mouth daily.     carvedilol 6.25 MG tablet  Commonly known as:  COREG  Take 6.25 mg by mouth 2 (two) times daily with a meal.     cholecalciferol 1000  UNITS tablet  Commonly known as:  VITAMIN D  Take 1,000 Units by mouth daily.     clonazePAM 0.5 MG tablet  Commonly known as:  KLONOPIN  Take one tablet by mouth at bedtime as needed     fluticasone 50 MCG/ACT nasal spray  Commonly known as:  FLONASE  Place 2 sprays into both nostrils at bedtime.     HYDROcodone-acetaminophen 5-325 MG per tablet  Commonly known as:  NORCO/VICODIN  Take one tablet by mouth every 6 hours as needed for pain     levothyroxine 150 MCG tablet  Commonly known as:  SYNTHROID, LEVOTHROID  Take 1 tablet (150 mcg total) by mouth daily.     losartan-hydrochlorothiazide 100-12.5 MG per tablet  Commonly known as:  HYZAAR  Take 1 tablet by mouth daily.     metFORMIN 500 MG 24 hr tablet  Commonly known as:  GLUCOPHAGE-XR   Take 1 tablet (500 mg total) by mouth 2 (two) times daily.     naproxen sodium 220 MG tablet  Commonly known as:  ANAPROX  Take 220 mg by mouth 2 (two) times daily with a meal. 220 mg q am, 440mg  q HS     RA KRILL OIL 500 MG Caps  Take 1 capsule by mouth daily.     sertraline 100 MG tablet  Commonly known as:  ZOLOFT  Take 100 mg by mouth daily.     spironolactone 25 MG tablet  Commonly known as:  ALDACTONE  Take 25 mg by mouth daily.     SYSTANE BALANCE 0.6 % Soln  Generic drug:  Propylene Glycol  Apply 1 drop to eye daily as needed.     vitamin C 500 MG tablet  Commonly known as:  ASCORBIC ACID  Take 500 mg by mouth daily.        Meds ordered this encounter  Medications  . Biotin 10 MG CAPS    Sig: Take 1 tablet by mouth daily.  . Calcium-Magnesium-Vitamin D (CALCIUM MAGNESIUM PO)    Sig: Take 1 tablet by mouth daily.  . cholecalciferol (VITAMIN D) 1000 UNITS tablet    Sig: Take 1,000 Units by mouth daily.  Marland Kitchen RA KRILL OIL 500 MG CAPS    Sig: Take 1 capsule by mouth daily.  . naproxen sodium (ANAPROX) 220 MG tablet    Sig: Take 220 mg by mouth 2 (two) times daily with a meal. 220 mg q am, 440mg  q HS  . Propylene Glycol (SYSTANE BALANCE) 0.6 % SOLN    Sig: Apply 1 drop to eye daily as needed.  . vitamin C (ASCORBIC ACID) 500 MG tablet    Sig: Take 500 mg by mouth daily.    Immunization History  Administered Date(s) Administered  . Pneumococcal Polysaccharide-23 01/18/2009  . Td 08/21/2003  . Zoster 01/18/2009    History  Substance Use Topics  . Smoking status: Never Smoker   . Smokeless tobacco: Never Used  . Alcohol Use: Yes     Comment: rare    Family history is noncontributory    Review of Systems  DATA OBTAINED: from patient, GENERAL: Feels well no fevers, fatigue, appetite changes SKIN: No itching, rash or wounds EYES: No eye pain, redness, discharge EARS: No earache, tinnitus, change in hearing NOSE: No congestion, drainage or bleeding   MOUTH/THROAT: No mouth or tooth pain, RESPIRATORY: No cough, wheezing, SOB CARDIAC: No chest pain, palpitations, lower extremity edema  GI: No abdominal pain, No N/V/D or constipation, No heartburn  or reflux  GU: No dysuria, frequency or urgency, or incontinence  MUSCULOSKELETAL: No unrelieved bone/joint pain NEUROLOGIC: No headache, dizziness or focal weakness PSYCHIATRIC: No overt anxiety or sadness. Sleeps well. No behavior issue.   Filed Vitals:   03/13/14 2333  BP: 90/54  Pulse: 68  Temp: 97.3 F (36.3 C)  Resp: 18    Physical Exam  GENERAL APPEARANCE: Alert, conversant. Appropriately groomed. No acute distress.  SKIN: No diaphoresis rash HEAD: Normocephalic, atraumatic  EYES: Conjunctiva/lids clear. Pupils round, reactive. EOMs intact.  EARS: External exam WNL, canals clear. Hearing grossly normal.  NOSE: No deformity or discharge.  MOUTH/THROAT: Lips w/o lesions.  RESPIRATORY: Breathing is even, unlabored. Lung sounds are clear   CARDIOVASCULAR: Heart RRR no murmurs, rubs or gallops. No peripheral edema.  GASTROINTESTINAL: Abdomen is soft, non-tender, not distended w/ normal bowel sounds GENITOURINARY: Bladder non tender, not distended  MUSCULOSKELETAL: body brace NEUROLOGIC: Oriented X3. Cranial nerves 2-12 grossly intact. Moves all extremities no tremor. PSYCHIATRIC: Mood and affect appropriate to situation, no behavioral issues  Patient Active Problem List   Diagnosis Date Noted  . Hypotension 03/01/2014  . Dehydration with hyponatremia 03/01/2014  . Acute respiratory failure with hypoxia 03/01/2014  . Spondylolisthesis of lumbar region 02/25/2014  . Chronic cough 09/16/2013  . Abnormal chest x-ray 09/16/2013  . HTN (hypertension) 10/22/2012  . Other and unspecified hyperlipidemia 10/22/2012  . Depression 10/22/2012  . Multiple thyroid nodules 10/22/2012  . BMI 40.0-44.9, adult 10/22/2012  . Unspecified hypothyroidism 10/22/2012  . DM (diabetes mellitus)  10/22/2012  . Lumbar spondylosis with myelopathy 10/22/2012    CBC    Component Value Date/Time   WBC 8.8 03/02/2014 0437   WBC 6.7 07/03/2013 1412   RBC 2.98* 03/02/2014 0437   RBC 4.84 07/03/2013 1412   HGB 9.4* 03/02/2014 0437   HGB 15.7 07/03/2013 1412   HCT 27.7* 03/02/2014 0437   HCT 48.9* 07/03/2013 1412   PLT 212 03/02/2014 0437   MCV 93.0 03/02/2014 0437   MCV 101.0* 07/03/2013 1412   LYMPHSABS 1.4 03/01/2014 0545   MONOABS 0.8 03/01/2014 0545   EOSABS 0.2 03/01/2014 0545   BASOSABS 0.0 03/01/2014 0545    CMP     Component Value Date/Time   NA 136* 03/02/2014 0437   K 4.1 03/02/2014 0437   CL 96 03/02/2014 0437   CO2 28 03/02/2014 0437   GLUCOSE 126* 03/02/2014 0437   BUN 16 03/02/2014 0437   CREATININE 0.81 03/02/2014 0437   CREATININE 1.03 10/22/2012 1305   CALCIUM 8.5 03/02/2014 0437   PROT 6.4 03/01/2014 0545   ALBUMIN 2.6* 03/01/2014 0545   AST 30 03/01/2014 0545   ALT 24 03/01/2014 0545   ALKPHOS 62 03/01/2014 0545   BILITOT 0.6 03/01/2014 0545   GFRNONAA 72* 03/02/2014 0437   GFRAA 84* 03/02/2014 0437    Assessment and Plan  Spondylolisthesis of lumbar region Admitted for surgery for correction-L 23 L 34 xlif; L 45 tlif; pedicle screws; no problems with the surgery per se but there were some medical problems;pt admitted for OT/PT.   Acute respiratory failure with hypoxia -seems mediated by hypoventilation during hypotension and encephalopathy  -CXR unremarkable   Hypotension resolved after hydration and dc of offending meds (diuretics and anti HTNs)   HTN (hypertension) Pt back on all HTN;BP is borderline;will monitor as pt will have better po and actvity  DM (diabetes mellitus) A1c 7.8 which is OK for now;metformin 500 BID  Unspecified hypothyroidism Cont synthroid 160mcg  Hennie Duos, MD

## 2014-03-10 ENCOUNTER — Other Ambulatory Visit: Payer: Self-pay | Admitting: *Deleted

## 2014-03-10 LAB — POCT I-STAT 4, (NA,K, GLUC, HGB,HCT)
GLUCOSE: 277 mg/dL — AB (ref 70–99)
HCT: 34 % — ABNORMAL LOW (ref 36.0–46.0)
Hemoglobin: 11.6 g/dL — ABNORMAL LOW (ref 12.0–15.0)
Potassium: 5.1 mEq/L (ref 3.7–5.3)
SODIUM: 133 meq/L — AB (ref 137–147)

## 2014-03-10 MED ORDER — HYDROCODONE-ACETAMINOPHEN 5-325 MG PO TABS: ORAL_TABLET | ORAL | Status: DC

## 2014-03-10 NOTE — Telephone Encounter (Signed)
[facility] 

## 2014-03-13 NOTE — Assessment & Plan Note (Signed)
Admitted for surgery for correction-L 23 L 34 xlif; L 45 tlif; pedicle screws; no problems with the surgery per se but there were some medical problems;pt admitted for OT/PT.

## 2014-03-14 NOTE — Assessment & Plan Note (Signed)
Cont synthroid 129mcg

## 2014-03-14 NOTE — Assessment & Plan Note (Signed)
resolved after hydration and dc of offending meds (diuretics and anti HTNs)

## 2014-03-14 NOTE — Assessment & Plan Note (Signed)
Pt back on all HTN;BP is borderline;will monitor as pt will have better po and actvity

## 2014-03-14 NOTE — Assessment & Plan Note (Signed)
-  seems mediated by hypoventilation during hypotension and encephalopathy  -CXR unremarkable

## 2014-03-14 NOTE — Assessment & Plan Note (Signed)
A1c 7.8 which is OK for now;metformin 500 BID

## 2014-03-15 ENCOUNTER — Non-Acute Institutional Stay (SKILLED_NURSING_FACILITY): Payer: Medicare Other | Admitting: Internal Medicine

## 2014-03-15 ENCOUNTER — Encounter: Payer: Self-pay | Admitting: Internal Medicine

## 2014-03-15 DIAGNOSIS — Z6841 Body Mass Index (BMI) 40.0 and over, adult: Secondary | ICD-10-CM

## 2014-03-15 DIAGNOSIS — E039 Hypothyroidism, unspecified: Secondary | ICD-10-CM

## 2014-03-15 DIAGNOSIS — M4310 Spondylolisthesis, site unspecified: Secondary | ICD-10-CM

## 2014-03-15 DIAGNOSIS — E119 Type 2 diabetes mellitus without complications: Secondary | ICD-10-CM

## 2014-03-15 DIAGNOSIS — M4316 Spondylolisthesis, lumbar region: Secondary | ICD-10-CM

## 2014-03-15 DIAGNOSIS — I10 Essential (primary) hypertension: Secondary | ICD-10-CM

## 2014-03-15 NOTE — Progress Notes (Signed)
MRN: [id number] Name: Alexis King  Sex: female Age: 68 y.o. DOB: Nov 24, 1945  [place] #: [facility] Facility/Room: 119 Level Of Care: SNF Provider: Inocencio Homes D Emergency Contacts: Extended Emergency Contact Information Primary Emergency Contact: Trager,Amy Address: [address]          [place], [place] [address] [place] Phone: [phone] Relation: Daughter Secondary Emergency Contact: Coates,Joseph Address: [place], [place] [address] [place] Phone: [phone] Mobile Phone: [phone] Relation: Spouse  Code Status: DNR  Allergies: Ace inhibitors; Clindamycin/lincomycin; Levaquin; Meloxicam; and Sulfa antibiotics  Chief Complaint  Patient presents with  . Discharge Note    HPI: Patient is 68 y.o. female who was admitted to SNF for OT/PT after back surgery.  Past Medical History  Diagnosis Date  . Hypertension   . Hyperlipidemia   . Depression   . Diabetes mellitus   . IBS (irritable bowel syndrome)   . Basal cell carcinoma of skin   . Coronary artery disease   . Dysrhythmia   . Pneumonia     early this year  . H/O hiatal hernia     otc  . GERD (gastroesophageal reflux disease)   . Arthritis   . Hypothyroidism     Past Surgical History  Procedure Laterality Date  . Cholecystectomy    . Cesarean section    . Lacrimal duct probing    . Basal cell carcinoma excision    . Thyroidectomy, partial    . Dilation and curettage of uterus    . Dental surgery      will have dental surgery 02/24/14, Root canal  . Root canal  02-24-14  . Anterior lat lumbar fusion N/A 02/25/2014    Procedure: ANTERIOR LATERAL LUMBAR FUSION 3 LEVELS;  Surgeon: Faythe Ghee, MD;  Location: [facility];  Service: Neurosurgery;  Laterality: N/A;  Lumbar One-Three Lateral Interbody  . Lumbar percutaneous pedicle screw 3 level  02/25/2014    Procedure: LUMBAR PERCUTANEOUS PEDICLE SCREW LUMBAR TWO-THREE,LUMBAR  THREE-FOUR,LUMBAR FOUR-FIVE;  Surgeon: Faythe Ghee, MD;  Location: [facility];  Service: Neurosurgery;;  . Transforaminal lumbar interbody fusion (tlif) with pedicle screw fixation 1 level  02/25/2014    Procedure: TRANSFORAMINAL LUMBAR INTERBODY FUSION (TLIF) WITH PEDICLE SCREW FIXATION 1 LEVEL;  Surgeon: Faythe Ghee, MD;  Location: [facility];  Service: Neurosurgery;;  Transforaminal Lumbar Four-Five Interbody      Medication List       This list is accurate as of: 03/15/14  9:44 PM.  Always use your most recent med list.               amLODipine 10 MG tablet  Commonly known as:  NORVASC  Take 5 mg by mouth daily.     aspirin 81 MG tablet  Take 81 mg by mouth daily.     atorvastatin 40 MG tablet  Commonly known as:  LIPITOR  Take 40 mg by mouth at bedtime.     Biotin 10 MG Caps  Take 1 tablet by mouth daily.     buPROPion 300 MG 24 hr tablet  Commonly known as:  WELLBUTRIN XL  Take 1 tablet by mouth  daily (patient needs office for additional refills)     CALCIUM MAGNESIUM PO  Take 1 tablet by mouth daily.     carvedilol 6.25 MG tablet  Commonly known as:  COREG  Take 6.25 mg by  mouth 2 (two) times daily with a meal.     cholecalciferol 1000 UNITS tablet  Commonly known as:  VITAMIN D  Take 1,000 Units by mouth daily.     clonazePAM 0.5 MG tablet  Commonly known as:  KLONOPIN  Take one tablet by mouth at bedtime as needed     fluticasone 50 MCG/ACT nasal spray  Commonly known as:  FLONASE  Place 2 sprays into both nostrils at bedtime.     HYDROcodone-acetaminophen 5-325 MG per tablet  Commonly known as:  NORCO/VICODIN  Take one tablet by mouth every 6 hours as needed for pain     levothyroxine 150 MCG tablet  Commonly known as:  SYNTHROID, LEVOTHROID  Take 1 tablet (150 mcg total) by mouth daily.     losartan-hydrochlorothiazide 100-12.5 MG per tablet  Commonly known as:  HYZAAR  Take 1 tablet by mouth daily.     metFORMIN 500 MG 24 hr tablet   Commonly known as:  GLUCOPHAGE-XR  Take 1 tablet (500 mg total) by mouth 2 (two) times daily.     multivitamin capsule  Take 1 capsule by mouth daily.     naproxen sodium 220 MG tablet  Commonly known as:  ANAPROX  Take 220 mg by mouth 2 (two) times daily with a meal. 220 mg q am, 440mg  q HS     RA KRILL OIL 500 MG Caps  Take 1 capsule by mouth daily.     sertraline 100 MG tablet  Commonly known as:  ZOLOFT  Take 100 mg by mouth daily.     spironolactone 25 MG tablet  Commonly known as:  ALDACTONE  Take 25 mg by mouth daily.     SYSTANE BALANCE 0.6 % Soln  Generic drug:  Propylene Glycol  Apply 1 drop to eye daily as needed.     vitamin C 500 MG tablet  Commonly known as:  ASCORBIC ACID  Take 500 mg by mouth daily.        Meds ordered this encounter  Medications  . aspirin 81 MG tablet    Sig: Take 81 mg by mouth daily.  . Multiple Vitamin (MULTIVITAMIN) capsule    Sig: Take 1 capsule by mouth daily.    Immunization History  Administered Date(s) Administered  . Pneumococcal Polysaccharide-23 01/18/2009  . Td 08/21/2003  . Zoster 01/18/2009    History  Substance Use Topics  . Smoking status: Never Smoker   . Smokeless tobacco: Never Used  . Alcohol Use: Yes     Comment: rare    Filed Vitals:   03/15/14 1719  BP: 113/74  Pulse: 72  Temp: 98.7 F (37.1 C)  Resp: 18    Physical Exam  GENERAL APPEARANCE: Alert, conversant. Appropriately groomed. No acute distress.  HEENT: Unremarkable. RESPIRATORY: Breathing is even, unlabored. Lung sounds are clear   CARDIOVASCULAR: Heart RRR no murmurs, rubs or gallops. No peripheral edema.  GASTROINTESTINAL: Abdomen is soft, non-tender, not distended w/ normal bowel sounds.  NEUROLOGIC: Cranial nerves 2-12 grossly intact. Moves all extremities no tremor.  Patient Active Problem List   Diagnosis Date Noted  . Hypotension 03/01/2014  . Dehydration with hyponatremia 03/01/2014  . Acute respiratory failure with  hypoxia 03/01/2014  . Spondylolisthesis of lumbar region 02/25/2014  . Chronic cough 09/16/2013  . Abnormal chest x-ray 09/16/2013  . HTN (hypertension) 10/22/2012  . Other and unspecified hyperlipidemia 10/22/2012  . Depression 10/22/2012  . Multiple thyroid nodules 10/22/2012  . BMI 40.0-44.9, adult 10/22/2012  .  Unspecified hypothyroidism 10/22/2012  . DM (diabetes mellitus) 10/22/2012  . Lumbar spondylosis with myelopathy 10/22/2012    CBC    Component Value Date/Time   WBC 8.8 03/02/2014 0437   WBC 6.7 07/03/2013 1412   RBC 2.98* 03/02/2014 0437   RBC 4.84 07/03/2013 1412   HGB 9.4* 03/02/2014 0437   HGB 15.7 07/03/2013 1412   HCT 27.7* 03/02/2014 0437   HCT 48.9* 07/03/2013 1412   PLT 212 03/02/2014 0437   MCV 93.0 03/02/2014 0437   MCV 101.0* 07/03/2013 1412   LYMPHSABS 1.4 03/01/2014 0545   MONOABS 0.8 03/01/2014 0545   EOSABS 0.2 03/01/2014 0545   BASOSABS 0.0 03/01/2014 0545    CMP     Component Value Date/Time   NA 136* 03/02/2014 0437   K 4.1 03/02/2014 0437   CL 96 03/02/2014 0437   CO2 28 03/02/2014 0437   GLUCOSE 126* 03/02/2014 0437   BUN 16 03/02/2014 0437   CREATININE 0.81 03/02/2014 0437   CREATININE 1.03 10/22/2012 1305   CALCIUM 8.5 03/02/2014 0437   PROT 6.4 03/01/2014 0545   ALBUMIN 2.6* 03/01/2014 0545   AST 30 03/01/2014 0545   ALT 24 03/01/2014 0545   ALKPHOS 62 03/01/2014 0545   BILITOT 0.6 03/01/2014 0545   GFRNONAA 72* 03/02/2014 0437   GFRAA 84* 03/02/2014 0437    Assessment and Plan  Pt is in improved and stable condition as far as ADL's. Pt does have a very small wound dehiscence that her surgeon wants her to go to the wound center for but she would like to do this from home. Will d/c with nursing/HH/OT/PT.  Hennie Duos, MD

## 2014-03-25 ENCOUNTER — Encounter (HOSPITAL_BASED_OUTPATIENT_CLINIC_OR_DEPARTMENT_OTHER): Payer: Medicare Other | Attending: Internal Medicine

## 2014-03-25 DIAGNOSIS — Z7982 Long term (current) use of aspirin: Secondary | ICD-10-CM | POA: Diagnosis not present

## 2014-03-25 DIAGNOSIS — Z9089 Acquired absence of other organs: Secondary | ICD-10-CM | POA: Insufficient documentation

## 2014-03-25 DIAGNOSIS — Y838 Other surgical procedures as the cause of abnormal reaction of the patient, or of later complication, without mention of misadventure at the time of the procedure: Secondary | ICD-10-CM | POA: Diagnosis not present

## 2014-03-25 DIAGNOSIS — T8189XA Other complications of procedures, not elsewhere classified, initial encounter: Secondary | ICD-10-CM | POA: Insufficient documentation

## 2014-03-25 DIAGNOSIS — I2510 Atherosclerotic heart disease of native coronary artery without angina pectoris: Secondary | ICD-10-CM | POA: Insufficient documentation

## 2014-03-25 DIAGNOSIS — E119 Type 2 diabetes mellitus without complications: Secondary | ICD-10-CM | POA: Insufficient documentation

## 2014-03-25 DIAGNOSIS — E669 Obesity, unspecified: Secondary | ICD-10-CM | POA: Diagnosis not present

## 2014-03-25 DIAGNOSIS — I10 Essential (primary) hypertension: Secondary | ICD-10-CM | POA: Insufficient documentation

## 2014-03-26 NOTE — Progress Notes (Signed)
[facility]  NAME:  Alexis King, Alexis King             ACCOUNT NO.:  [id number]  MEDICAL RECORD NO.:  [id number]      DATE OF BIRTH:  February 06, 1946  PHYSICIAN:  Judene Companion, M.D.      VISIT DATE:  03/25/2014                                  OFFICE VISIT   SUBJECTIVE:  This is a 68 year old obese female who comes to [place] because a month ago, she had significant effusion of her back from about L4 to S3.  She has hardware in her back.  She has two wounds parallel to her spine in this area plus a transverse wound over on her flank.  She has an open draining area on one of these parallel incisions on her back that is open about 3 cm.  I cultured this and probed it and we packed it with silver alginate, and I put her on doxycycline just because I am afraid of infection in this area following hardware being placed for a fusion in her back.  This lady has diabetes, hypertension, coronary artery disease, hiatal hernia, and she is also obese.  MEDICATIONS:  Her medicines are fish oil, vitamins, calcium, Norco, aspirin, Klonopin, spironolactone, Flonase, and amlodipine for hypertension.  OBJECTIVE:  VITAL SIGNS:  This lady's vital signs today showed a blood pressure 98/59, pulse 80, temperature 98.  She weighs 225 pounds and she is 5 feet 2 inches.  ASSESSMENT AND PLAN:  So, I will put her down as nonhealing surgical wound of the back following a fusion of lumbosacral spine.  Also, diabetic hypertension, coronary artery disease, and diabetes.     Judene Companion, M.D.     PP/MEDQ  D:  03/25/2014  T:  03/26/2014  Job:  [id number]

## 2014-03-30 ENCOUNTER — Other Ambulatory Visit: Payer: Self-pay | Admitting: Internal Medicine

## 2014-04-01 DIAGNOSIS — E669 Obesity, unspecified: Secondary | ICD-10-CM | POA: Diagnosis not present

## 2014-04-01 DIAGNOSIS — T8189XA Other complications of procedures, not elsewhere classified, initial encounter: Secondary | ICD-10-CM | POA: Diagnosis not present

## 2014-04-01 DIAGNOSIS — I10 Essential (primary) hypertension: Secondary | ICD-10-CM | POA: Diagnosis not present

## 2014-04-01 DIAGNOSIS — E119 Type 2 diabetes mellitus without complications: Secondary | ICD-10-CM | POA: Diagnosis not present

## 2014-04-05 ENCOUNTER — Other Ambulatory Visit: Payer: Self-pay | Admitting: *Deleted

## 2014-04-06 ENCOUNTER — Encounter: Payer: Self-pay | Admitting: Internal Medicine

## 2014-04-06 MED ORDER — SPIRONOLACTONE 25 MG PO TABS: 25.0000 mg | ORAL_TABLET | Freq: Every day | ORAL | Status: DC

## 2014-04-06 NOTE — Telephone Encounter (Signed)
Dr Laney Pastor, this med was on pt's med list at her 02/2014 OV w/you, but I don't see that you have ever Rxd it. Do you want to give RFs?

## 2014-04-08 DIAGNOSIS — T8189XA Other complications of procedures, not elsewhere classified, initial encounter: Secondary | ICD-10-CM | POA: Diagnosis not present

## 2014-04-08 DIAGNOSIS — E669 Obesity, unspecified: Secondary | ICD-10-CM | POA: Diagnosis not present

## 2014-04-08 DIAGNOSIS — I10 Essential (primary) hypertension: Secondary | ICD-10-CM | POA: Diagnosis not present

## 2014-04-08 DIAGNOSIS — E119 Type 2 diabetes mellitus without complications: Secondary | ICD-10-CM | POA: Diagnosis not present

## 2014-04-15 DIAGNOSIS — T8189XA Other complications of procedures, not elsewhere classified, initial encounter: Secondary | ICD-10-CM | POA: Diagnosis not present

## 2014-04-15 DIAGNOSIS — E669 Obesity, unspecified: Secondary | ICD-10-CM | POA: Diagnosis not present

## 2014-04-15 DIAGNOSIS — E119 Type 2 diabetes mellitus without complications: Secondary | ICD-10-CM | POA: Diagnosis not present

## 2014-04-15 DIAGNOSIS — I10 Essential (primary) hypertension: Secondary | ICD-10-CM | POA: Diagnosis not present

## 2014-04-22 ENCOUNTER — Encounter (HOSPITAL_BASED_OUTPATIENT_CLINIC_OR_DEPARTMENT_OTHER): Payer: Medicare Other | Attending: Internal Medicine

## 2014-04-22 DIAGNOSIS — Y838 Other surgical procedures as the cause of abnormal reaction of the patient, or of later complication, without mention of misadventure at the time of the procedure: Secondary | ICD-10-CM | POA: Diagnosis not present

## 2014-04-22 DIAGNOSIS — T8189XA Other complications of procedures, not elsewhere classified, initial encounter: Secondary | ICD-10-CM | POA: Diagnosis not present

## 2014-04-29 ENCOUNTER — Telehealth: Payer: Self-pay | Admitting: *Deleted

## 2014-04-29 DIAGNOSIS — T8189XA Other complications of procedures, not elsewhere classified, initial encounter: Secondary | ICD-10-CM | POA: Diagnosis not present

## 2014-04-29 NOTE — Telephone Encounter (Signed)
Phoned [facility] to get copy of patient's most recent diabetic eye exam, but according to their records, she was seen earlier this year, but for "blurred vision", and has not had a diabetic eye exam since 2013.  They are going to send patient a mailed letter reminding her that she needs to schedule an OV and since patient's noted communication preference is via [facility], I am going to send her a reminder as well.

## 2014-05-02 ENCOUNTER — Encounter: Payer: Self-pay | Admitting: Internal Medicine

## 2014-05-03 ENCOUNTER — Other Ambulatory Visit: Payer: Self-pay | Admitting: Internal Medicine

## 2014-05-03 MED ORDER — NITROFURANTOIN MONOHYD MACRO 100 MG PO CAPS: 100.0000 mg | ORAL_CAPSULE | Freq: Two times a day (BID) | ORAL | Status: DC

## 2014-05-03 NOTE — Telephone Encounter (Signed)
Pt reports she only has urinary frequency, flank pain and a small amount of burning with urination. No fever, nausea. She states she would like the macrobid send to [facility]

## 2014-05-04 MED ORDER — BUPROPION HCL ER (XL) 300 MG PO TB24: ORAL_TABLET | ORAL | Status: DC

## 2014-05-06 DIAGNOSIS — T8189XA Other complications of procedures, not elsewhere classified, initial encounter: Secondary | ICD-10-CM | POA: Diagnosis not present

## 2014-05-13 DIAGNOSIS — T8189XA Other complications of procedures, not elsewhere classified, initial encounter: Secondary | ICD-10-CM | POA: Diagnosis not present

## 2014-05-20 ENCOUNTER — Encounter (HOSPITAL_BASED_OUTPATIENT_CLINIC_OR_DEPARTMENT_OTHER): Payer: Medicare Other | Attending: Internal Medicine

## 2014-05-20 DIAGNOSIS — T8189XA Other complications of procedures, not elsewhere classified, initial encounter: Secondary | ICD-10-CM | POA: Diagnosis not present

## 2014-05-20 DIAGNOSIS — Y839 Surgical procedure, unspecified as the cause of abnormal reaction of the patient, or of later complication, without mention of misadventure at the time of the procedure: Secondary | ICD-10-CM | POA: Diagnosis not present

## 2014-05-26 DIAGNOSIS — T8189XA Other complications of procedures, not elsewhere classified, initial encounter: Secondary | ICD-10-CM | POA: Diagnosis not present

## 2014-06-02 ENCOUNTER — Encounter: Payer: Self-pay | Admitting: Internal Medicine

## 2014-06-02 ENCOUNTER — Ambulatory Visit (INDEPENDENT_AMBULATORY_CARE_PROVIDER_SITE_OTHER): Payer: Medicare Other | Admitting: Internal Medicine

## 2014-06-02 ENCOUNTER — Other Ambulatory Visit: Payer: Self-pay | Admitting: Radiology

## 2014-06-02 VITALS — BP 130/88 | HR 73 | Temp 97.7°F | Resp 16 | Ht 62.0 in | Wt 222.8 lb

## 2014-06-02 DIAGNOSIS — Z23 Encounter for immunization: Secondary | ICD-10-CM

## 2014-06-02 DIAGNOSIS — E785 Hyperlipidemia, unspecified: Secondary | ICD-10-CM

## 2014-06-02 DIAGNOSIS — Z0000 Encounter for general adult medical examination without abnormal findings: Secondary | ICD-10-CM

## 2014-06-02 DIAGNOSIS — Z1212 Encounter for screening for malignant neoplasm of rectum: Secondary | ICD-10-CM

## 2014-06-02 DIAGNOSIS — I10 Essential (primary) hypertension: Secondary | ICD-10-CM

## 2014-06-02 DIAGNOSIS — Z6841 Body Mass Index (BMI) 40.0 and over, adult: Secondary | ICD-10-CM

## 2014-06-02 DIAGNOSIS — E119 Type 2 diabetes mellitus without complications: Secondary | ICD-10-CM

## 2014-06-02 DIAGNOSIS — Z1211 Encounter for screening for malignant neoplasm of colon: Secondary | ICD-10-CM

## 2014-06-02 LAB — CBC WITH DIFFERENTIAL/PLATELET
Basophils Absolute: 0.1 10*3/uL (ref 0.0–0.1)
Basophils Relative: 1 % (ref 0–1)
Eosinophils Absolute: 0.3 10*3/uL (ref 0.0–0.7)
Eosinophils Relative: 4 % (ref 0–5)
HCT: 42.6 % (ref 36.0–46.0)
HEMOGLOBIN: 14.7 g/dL (ref 12.0–15.0)
LYMPHS ABS: 1.6 10*3/uL (ref 0.7–4.0)
Lymphocytes Relative: 23 % (ref 12–46)
MCH: 30.9 pg (ref 26.0–34.0)
MCHC: 34.5 g/dL (ref 30.0–36.0)
MCV: 89.5 fL (ref 78.0–100.0)
MONOS PCT: 7 % (ref 3–12)
Monocytes Absolute: 0.5 10*3/uL (ref 0.1–1.0)
NEUTROS ABS: 4.5 10*3/uL (ref 1.7–7.7)
Neutrophils Relative %: 65 % (ref 43–77)
Platelets: 254 10*3/uL (ref 150–400)
RBC: 4.76 MIL/uL (ref 3.87–5.11)
RDW: 14.8 % (ref 11.5–15.5)
WBC: 6.9 10*3/uL (ref 4.0–10.5)

## 2014-06-02 LAB — POCT GLYCOSYLATED HEMOGLOBIN (HGB A1C): HEMOGLOBIN A1C: 7.1

## 2014-06-02 LAB — POCT URINALYSIS DIPSTICK
Bilirubin, UA: NEGATIVE
Blood, UA: NEGATIVE
GLUCOSE UA: NEGATIVE
Ketones, UA: NEGATIVE
Nitrite, UA: NEGATIVE
PROTEIN UA: NEGATIVE
SPEC GRAV UA: 1.02
UROBILINOGEN UA: 0.2
pH, UA: 5.5

## 2014-06-02 MED ORDER — CLONAZEPAM 0.5 MG PO TABS: ORAL_TABLET | ORAL | Status: DC

## 2014-06-02 NOTE — Telephone Encounter (Signed)
Called in the Clonazepam for patient.

## 2014-06-02 NOTE — Progress Notes (Signed)
Subjective:    Patient ID: Alexis King, female    DOB: [birth year], 68 y.o.   MRN: [id number] This chart was scribed for Tami Lin, MD by Zola Button, Medical Scribe. This patient was seen in Room 25 and the patient's care was started at 12:56 PM.   HPI HPI Comments: Alexis King is a 68 y.o. female who presents to the [facility] for an annual exam and medication refill.  She had a recent back surgery about 3 months ago. She was kept in ICU for 6 days because she was delirious for the first 3 days after. Patient was on anaesthesia for 8 hours and was hyponatremic. She was seen for a UTI that she thought was secondary to the catheter. Patient is doing rehab at home because PT is too expensive. She has been feeling better on her right side since surgery, but she was unable to move her right leg after surgery. Patient was put on doxycycline. She was sent to the wound care center for an incision that dehisced. Patient takes her cane out when she is in unknown territory. She notes some difficulty with stairs.  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  Patient has been putting off her colonoscopy. She recently drove to [facility] for her husband's class reunion. Patient is interested in discussing bone growth stimulators and is considering trying them.  Past Surgical History  Procedure Laterality Date  . Cholecystectomy    . Cesarean section    . Lacrimal duct probing    . Basal cell carcinoma excision    . Thyroidectomy, partial    . Dilation and curettage of uterus    . Dental surgery      will have dental surgery 02/24/14, Root canal  . Root canal  02-24-14  . Anterior lat lumbar fusion N/A 02/25/2014    Procedure: ANTERIOR LATERAL LUMBAR FUSION 3 LEVELS;  Surgeon: Faythe Ghee, MD;  Location: [facility];  Service: Neurosurgery;  Laterality: N/A;  Lumbar One-Three  Lateral Interbody  . Lumbar percutaneous pedicle screw 3 level  02/25/2014    Procedure: LUMBAR PERCUTANEOUS PEDICLE SCREW LUMBAR TWO-THREE,LUMBAR THREE-FOUR,LUMBAR FOUR-FIVE;  Surgeon: Faythe Ghee, MD;  Location: [facility];  Service: Neurosurgery;;  . Transforaminal lumbar interbody fusion (tlif) with pedicle screw fixation 1 level  02/25/2014    Procedure: TRANSFORAMINAL LUMBAR INTERBODY FUSION (TLIF) WITH PEDICLE SCREW FIXATION 1 LEVEL;  Surgeon: Faythe Ghee, MD;  Location: [facility];  Service: Neurosurgery;;  Transforaminal Lumbar Four-Five Interbody  . Cosmetic surgery    . Small intestine surgery     Patient Active Problem List   Diagnosis Date Noted  . Hypotension 03/01/2014  . Dehydration with hyponatremia 03/01/2014  . Acute respiratory failure with hypoxia 03/01/2014  . Spondylolisthesis of lumbar region 02/25/2014  . Chronic cough 09/16/2013  . Abnormal chest x-ray 09/16/2013  . HTN (hypertension) 10/22/2012  . Other and unspecified hyperlipidemia 10/22/2012  . Depression 10/22/2012  . Multiple thyroid nodules 10/22/2012  . BMI 40.0-44.9, adult 10/22/2012  . Unspecified hypothyroidism 10/22/2012  . DM (diabetes mellitus) 10/22/2012  . Lumbar spondylosis with myelopathy 10/22/2012     Review of Systems See cna note     Objective:   Physical Exam  Nursing note and vitals reviewed. Constitutional: She is oriented to person, place, and time. She appears well-developed  and well-nourished. No distress.  HENT:  Head: Normocephalic and atraumatic.  Right Ear: External ear normal.  Left Ear: External ear normal.  Nose: Nose normal.  Mouth/Throat: Oropharynx is clear and moist.  Eyes: Conjunctivae and EOM are normal. Pupils are equal, round, and reactive to light.  Neck: Normal range of motion. Neck supple. No thyromegaly present.  Cardiovascular: Normal rate, regular rhythm, normal heart sounds and intact distal pulses.   No murmur heard. Pulmonary/Chest:  Effort normal and breath sounds normal. She has no wheezes.  Abdominal: Soft. Bowel sounds are normal. She exhibits no distension and no mass. There is no tenderness. There is no rebound.  Musculoskeletal: Normal range of motion. She exhibits no edema and no tenderness.  Lymphadenopathy:    She has no cervical adenopathy.  Neurological: She is alert and oriented to person, place, and time. She has normal reflexes. No cranial nerve deficit.  Skin: Skin is warm and dry. No rash noted.  Psychiatric: She has a normal mood and affect. Her behavior is normal. Judgment and thought content normal.          Assessment & Plan:    I have completed the patient encounter in its entirety as documented by the scribe, with editing by me where necessary. Robert P. Laney Pastor, M.D. Routine general medical examination at a health care facility  Medicare f/u wellness visit Hyperlipidemia Essential hypertension, benign - Plan: CBC with Differential, POCT urinalysis dipstick  Flu vaccine need - Plan: Flu Vaccine QUAD 36+ mos IM  Need for prophylactic vaccination against Streptococcus pneumoniae (pneumococcus) - Plan: Pneumococcal conjugate vaccine 13-valent IM  Diabetes mellitus without complication - Plan: POCT glycosylated hemoglobin (Hb A1C)  Screening for colorectal cancer - Plan: IFOBT POC (occult bld, rslt in office), Ambulatory referral to Gastroenterology  BMI 40.0-44.9, adult  No chg in med mgmt

## 2014-06-02 NOTE — Patient Instructions (Addendum)
Eye exam colonoscopy Tdap Exercise-weight loss

## 2014-06-02 NOTE — Progress Notes (Signed)
   Subjective:    Patient ID: Alexis King, female    DOB: 13-Aug-1946, 68 y.o.   MRN: [id number]  HPI    Review of Systems  Constitutional: Positive for activity change.  HENT: Positive for postnasal drip and sinus pressure.   Eyes: Positive for discharge.  Respiratory: Negative.   Cardiovascular: Positive for palpitations and leg swelling.  Gastrointestinal: Positive for abdominal pain and diarrhea.  Endocrine: Negative.   Genitourinary: Negative.   Musculoskeletal: Positive for arthralgias, back pain, neck pain and neck stiffness.  Allergic/Immunologic: Positive for environmental allergies.  Neurological: Positive for numbness.  Hematological: Bruises/bleeds easily.  Psychiatric/Behavioral: Positive for sleep disturbance and dysphoric mood. The patient is nervous/anxious.        Objective:   Physical Exam        Assessment & Plan:

## 2014-06-03 ENCOUNTER — Encounter: Payer: Self-pay | Admitting: *Deleted

## 2014-06-08 ENCOUNTER — Encounter: Payer: Self-pay | Admitting: *Deleted

## 2014-06-12 ENCOUNTER — Encounter: Payer: Self-pay | Admitting: Internal Medicine

## 2014-06-14 ENCOUNTER — Encounter: Payer: Self-pay | Admitting: Internal Medicine

## 2014-06-14 LAB — HM DIABETES EYE EXAM

## 2014-06-16 LAB — IFOBT (OCCULT BLOOD): IMMUNOLOGICAL FECAL OCCULT BLOOD TEST: NEGATIVE

## 2014-07-08 ENCOUNTER — Encounter: Payer: Self-pay | Admitting: Internal Medicine

## 2014-07-20 ENCOUNTER — Encounter: Payer: Self-pay | Admitting: Internal Medicine

## 2014-07-30 ENCOUNTER — Other Ambulatory Visit: Payer: Self-pay

## 2014-07-30 MED ORDER — CARVEDILOL 6.25 MG PO TABS: 6.2500 mg | ORAL_TABLET | Freq: Two times a day (BID) | ORAL | Status: DC

## 2014-07-30 MED ORDER — ATORVASTATIN CALCIUM 40 MG PO TABS: 40.0000 mg | ORAL_TABLET | Freq: Every day | ORAL | Status: DC

## 2014-07-30 NOTE — Telephone Encounter (Signed)
Dr Laney Pastor, [facility] faxed req for RFs of atorvastatin, or carvedilol. They are on pt's med list at most recent OV but I don't see that you have Rxd them before. Do you want to?

## 2014-08-05 ENCOUNTER — Encounter: Payer: Self-pay | Admitting: Internal Medicine

## 2014-08-06 ENCOUNTER — Other Ambulatory Visit: Payer: Self-pay | Admitting: Internal Medicine

## 2014-08-06 MED ORDER — CLONAZEPAM 0.5 MG PO TABS: ORAL_TABLET | ORAL | Status: DC

## 2014-08-08 ENCOUNTER — Other Ambulatory Visit: Payer: Self-pay | Admitting: Internal Medicine

## 2014-08-09 NOTE — Telephone Encounter (Signed)
Faxed Rx

## 2014-08-23 DIAGNOSIS — Z981 Arthrodesis status: Secondary | ICD-10-CM | POA: Diagnosis not present

## 2014-08-24 DIAGNOSIS — M4316 Spondylolisthesis, lumbar region: Secondary | ICD-10-CM | POA: Diagnosis not present

## 2014-08-25 DIAGNOSIS — M4316 Spondylolisthesis, lumbar region: Secondary | ICD-10-CM | POA: Diagnosis not present

## 2014-08-30 ENCOUNTER — Other Ambulatory Visit: Payer: Self-pay

## 2014-08-30 ENCOUNTER — Encounter: Payer: Self-pay | Admitting: Internal Medicine

## 2014-08-30 MED ORDER — CARVEDILOL 6.25 MG PO TABS: 6.2500 mg | ORAL_TABLET | Freq: Two times a day (BID) | ORAL | Status: DC

## 2014-08-30 NOTE — Telephone Encounter (Signed)
Pharm reqs emergency fill of carvedilol while pt waits for mail order.

## 2014-08-31 MED ORDER — ATORVASTATIN CALCIUM 40 MG PO TABS: 40.0000 mg | ORAL_TABLET | Freq: Every day | ORAL | Status: DC

## 2014-08-31 MED ORDER — CARVEDILOL 6.25 MG PO TABS: 6.2500 mg | ORAL_TABLET | Freq: Two times a day (BID) | ORAL | Status: DC

## 2014-09-16 DIAGNOSIS — C44622 Squamous cell carcinoma of skin of right upper limb, including shoulder: Secondary | ICD-10-CM | POA: Diagnosis not present

## 2014-10-06 DIAGNOSIS — H04121 Dry eye syndrome of right lacrimal gland: Secondary | ICD-10-CM | POA: Diagnosis not present

## 2014-10-06 DIAGNOSIS — H04122 Dry eye syndrome of left lacrimal gland: Secondary | ICD-10-CM | POA: Diagnosis not present

## 2014-10-06 DIAGNOSIS — H25011 Cortical age-related cataract, right eye: Secondary | ICD-10-CM | POA: Diagnosis not present

## 2014-10-06 DIAGNOSIS — H2511 Age-related nuclear cataract, right eye: Secondary | ICD-10-CM | POA: Diagnosis not present

## 2014-10-12 DIAGNOSIS — M4316 Spondylolisthesis, lumbar region: Secondary | ICD-10-CM | POA: Diagnosis not present

## 2014-10-18 ENCOUNTER — Encounter: Payer: Self-pay | Admitting: Internal Medicine

## 2014-10-22 ENCOUNTER — Other Ambulatory Visit: Payer: Self-pay

## 2014-10-22 MED ORDER — SPIRONOLACTONE 25 MG PO TABS: ORAL_TABLET | ORAL | Status: DC

## 2014-10-22 NOTE — Telephone Encounter (Signed)
Dr Laney Pastor I received fax from [facility] requesting 90 day supply of spirolactone, and it looks like pt had req'd the change in email also. In Oct, you advised pt to RTC in 3 mos, but pt has appt scheduled for next CPE this coming Oct. I assume you want to see her before the appt, and when does she need f/up? Do you want to OK the 90 day RF?

## 2014-11-08 DIAGNOSIS — H04122 Dry eye syndrome of left lacrimal gland: Secondary | ICD-10-CM | POA: Diagnosis not present

## 2014-11-08 DIAGNOSIS — H00019 Hordeolum externum unspecified eye, unspecified eyelid: Secondary | ICD-10-CM | POA: Diagnosis not present

## 2014-11-08 DIAGNOSIS — H04212 Epiphora due to excess lacrimation, left lacrimal gland: Secondary | ICD-10-CM | POA: Diagnosis not present

## 2014-11-08 DIAGNOSIS — H04121 Dry eye syndrome of right lacrimal gland: Secondary | ICD-10-CM | POA: Diagnosis not present

## 2014-11-15 ENCOUNTER — Other Ambulatory Visit: Payer: Self-pay | Admitting: Internal Medicine

## 2014-11-16 NOTE — Telephone Encounter (Signed)
Dr Laney Pastor, per protocol, I can only fill these meds for 6 mos after last OV for these issues (and 3 mos for her DM meds), but pt has several emails to you asking for 90 day supplies on her meds through mail order. She doesn't have an appt scheduled w/you until October which is 1 yr since her last CPE. Do you want to OK 90 days RF, and do you need to see pt back before Oct?

## 2014-11-18 ENCOUNTER — Other Ambulatory Visit: Payer: Self-pay

## 2014-11-18 MED ORDER — CARVEDILOL 6.25 MG PO TABS: 6.2500 mg | ORAL_TABLET | Freq: Two times a day (BID) | ORAL | Status: DC

## 2014-11-18 MED ORDER — SERTRALINE HCL 100 MG PO TABS: 100.0000 mg | ORAL_TABLET | Freq: Every day | ORAL | Status: DC

## 2014-11-18 NOTE — Telephone Encounter (Signed)
Dr Laney Pastor just OKd RFs until pt's appt in Oct. Sending RFs of zoloft too.

## 2014-11-18 NOTE — Telephone Encounter (Signed)
Dr Laney Pastor just OKd RFs of other chronic meds until pt's appt in Oct. Will send in RFs of this also.

## 2014-11-19 ENCOUNTER — Other Ambulatory Visit: Payer: Self-pay | Admitting: Internal Medicine

## 2014-11-19 NOTE — Telephone Encounter (Signed)
Expand All Collapse All   Dr Laney Pastor just OKd RFs of other chronic meds until pt's appt in Oct. Will send in RFs of this also.

## 2014-11-30 DIAGNOSIS — Z1231 Encounter for screening mammogram for malignant neoplasm of breast: Secondary | ICD-10-CM | POA: Diagnosis not present

## 2014-12-08 DIAGNOSIS — D485 Neoplasm of uncertain behavior of skin: Secondary | ICD-10-CM | POA: Diagnosis not present

## 2014-12-08 DIAGNOSIS — C44629 Squamous cell carcinoma of skin of left upper limb, including shoulder: Secondary | ICD-10-CM | POA: Diagnosis not present

## 2014-12-21 ENCOUNTER — Encounter: Payer: Self-pay | Admitting: Internal Medicine

## 2014-12-24 ENCOUNTER — Other Ambulatory Visit: Payer: Self-pay

## 2014-12-24 MED ORDER — METFORMIN HCL ER 500 MG PO TB24: ORAL_TABLET | ORAL | Status: DC

## 2015-01-03 ENCOUNTER — Encounter: Payer: Self-pay | Admitting: Internal Medicine

## 2015-01-03 DIAGNOSIS — H04122 Dry eye syndrome of left lacrimal gland: Secondary | ICD-10-CM | POA: Diagnosis not present

## 2015-01-03 DIAGNOSIS — H00019 Hordeolum externum unspecified eye, unspecified eyelid: Secondary | ICD-10-CM | POA: Diagnosis not present

## 2015-01-03 DIAGNOSIS — H04212 Epiphora due to excess lacrimation, left lacrimal gland: Secondary | ICD-10-CM | POA: Diagnosis not present

## 2015-01-03 DIAGNOSIS — H04121 Dry eye syndrome of right lacrimal gland: Secondary | ICD-10-CM | POA: Diagnosis not present

## 2015-01-26 DIAGNOSIS — C44622 Squamous cell carcinoma of skin of right upper limb, including shoulder: Secondary | ICD-10-CM | POA: Diagnosis not present

## 2015-03-24 ENCOUNTER — Other Ambulatory Visit: Payer: Self-pay | Admitting: Internal Medicine

## 2015-03-25 NOTE — Telephone Encounter (Signed)
Do you want to approve 90 day supplies to mail order to cover her until after her appt 10/19? (hasn't been seen since last Oct). If a PA fills this in Dr Doolittle's absence, please sign in his name since it's [facility].

## 2015-03-26 NOTE — Telephone Encounter (Signed)
Ok to refill for 3 months.  

## 2015-03-29 ENCOUNTER — Other Ambulatory Visit: Payer: Self-pay

## 2015-03-29 NOTE — Telephone Encounter (Signed)
Dr Laney Pastor, do you want to RF for 90 days? Pt has appt sch for 06/08/15.

## 2015-03-30 MED ORDER — CLONAZEPAM 0.5 MG PO TABS: ORAL_TABLET | ORAL | Status: DC

## 2015-03-30 NOTE — Telephone Encounter (Signed)
Done

## 2015-03-30 NOTE — Telephone Encounter (Signed)
Faxed

## 2015-04-18 ENCOUNTER — Encounter: Payer: Self-pay | Admitting: Podiatry

## 2015-04-18 ENCOUNTER — Ambulatory Visit: Payer: Self-pay

## 2015-04-18 ENCOUNTER — Ambulatory Visit (INDEPENDENT_AMBULATORY_CARE_PROVIDER_SITE_OTHER): Payer: Medicare Other | Admitting: Podiatry

## 2015-04-18 VITALS — BP 138/78 | HR 78 | Resp 16

## 2015-04-18 DIAGNOSIS — M2041 Other hammer toe(s) (acquired), right foot: Secondary | ICD-10-CM

## 2015-04-18 DIAGNOSIS — L600 Ingrowing nail: Secondary | ICD-10-CM

## 2015-04-18 DIAGNOSIS — M21611 Bunion of right foot: Secondary | ICD-10-CM

## 2015-04-18 DIAGNOSIS — M2011 Hallux valgus (acquired), right foot: Secondary | ICD-10-CM

## 2015-04-18 NOTE — Progress Notes (Signed)
   Subjective:    Patient ID: Alexis King, female    DOB: 04/01/1946, 69 y.o.   MRN: [id number]  HPI  Pt presents with bilateral hallux nails that are thick, discolored and splitting. Hammertoe on right foot and bunion on right foot are starting to hurt her when wearing enclosed shoes  Review of Systems  Constitutional: Positive for fatigue.  Respiratory: Positive for cough.   Musculoskeletal: Positive for back pain.  Psychiatric/Behavioral: The patient is nervous/anxious.   All other systems reviewed and are negative.      Objective:   Physical Exam        Assessment & Plan:

## 2015-04-18 NOTE — Patient Instructions (Signed)

## 2015-04-19 ENCOUNTER — Telehealth: Payer: Self-pay | Admitting: *Deleted

## 2015-04-19 NOTE — Progress Notes (Signed)
Subjective:     Patient ID: Alexis King, female   DOB: 11-09-1945, 69 y.o.   MRN: [id number]  HPI patient presents with multiple problems with damaged hallux and second nails bilateral with splitting like nailbeds along with structural hyperostosis medial aspect first metatarsal head bilateral and hammertoe deformity the second digit right foot   Review of Systems  All other systems reviewed and are negative.      Objective:   Physical Exam  Constitutional: She is oriented to person, place, and time.  Cardiovascular: Intact distal pulses.   Musculoskeletal: Normal range of motion.  Neurological: She is oriented to person, place, and time.  Skin: Skin is warm and dry.  Nursing note and vitals reviewed.  neurovascular status intact with muscle strength adequate range of motion within normal limits and patient noted to have significant nail disease with incurvation of the beds hallux and second bilateral with long-term issues associated with the deformity of these nailbeds. Patient's noted to have structural hyperostosis medial aspect first metatarsal head right over left with deviation of the hallux against the second toe and is noted to have distal deformity of the second digit right with mild trauma secondary to injury sustained 2 weeks ago right foot     Assessment:     Damage nailbed hallux second left and right with pain along with structural bunion deformity bilateral and hammertoe deformity with trauma second digit right    Plan:     H&P and x-rays reviewed with patient. I do think ultimately digital correction could be obtained but were get a hold off on this and focus on nailbeds which bother her the most at this time. I'm first get into the left as the right second toe is damaged and I do not want to do any nail until head has healed. I explained the condition and explained risk and she wants surgery and today I infiltrated the left hallux and second toes with 120 mg  Xylocaine Marcaine mixture remove the hallux nail second nail exposed matrix and applied phenol 5 and for applications to the nailbeds followed by alcohol lavage and sterile dressing. Gave instructions on soaks and reappoint in 2 weeks for correction of right

## 2015-04-19 NOTE — Telephone Encounter (Signed)
Called patient at [phone] (Home #) to check to see how they were feeling from their ingrown toenail procedure that was done yesterday, April 18, 2015. Pt stated, "feels fine and has no pain". Pt also stated, "has some bruises where got injection at".

## 2015-05-02 ENCOUNTER — Ambulatory Visit: Payer: Medicare Other | Admitting: Podiatry

## 2015-05-04 ENCOUNTER — Ambulatory Visit (INDEPENDENT_AMBULATORY_CARE_PROVIDER_SITE_OTHER): Payer: Medicare Other | Admitting: Podiatry

## 2015-05-04 ENCOUNTER — Encounter: Payer: Self-pay | Admitting: Podiatry

## 2015-05-04 VITALS — BP 130/71 | HR 68 | Resp 16

## 2015-05-04 DIAGNOSIS — L600 Ingrowing nail: Secondary | ICD-10-CM | POA: Diagnosis not present

## 2015-05-04 MED ORDER — NEOMYCIN-POLYMYXIN-HC 3.5-10000-1 OT SOLN: OTIC | Status: DC

## 2015-05-04 NOTE — Progress Notes (Signed)
Subjective:     Patient ID: Alexis King, female   DOB: 07/15/1946, 69 y.o.   MRN: [id number]  HPI patient presents stating I need to get the nails on my right foot fixed and the ones in the left are red and I wanted you to check   Review of Systems     Objective:   Physical Exam Neurovascular status intact muscle strength adequate with patient having damaged hallux and second nail right that are incurvated thick and painful and the left hallux and second nails are draining but I do think they're healing normal    Assessment:     Damaged right hallux and second nailbeds that are painful along with hammertoe deformity right and healing nails left that are red but healing appropriately    Plan:     Discussed the left and she will continue soaks and for the right I've recommended correction and she wants to get this done understanding risk. I infiltrated the right hallux and second toe 120 mg Xylocaine Marcaine mixture and removed the hallux and second nailbeds exposing matrix and applied phenol 3 applications 30 seconds followed by alcohol lavage and sterile dressing. Gave instructions on soaks and reappoint

## 2015-05-04 NOTE — Patient Instructions (Signed)

## 2015-05-05 ENCOUNTER — Telehealth: Payer: Self-pay | Admitting: *Deleted

## 2015-05-05 NOTE — Telephone Encounter (Signed)
Called patient [phone] (Home #) to check to see how they were feeling from their ingrown toenail procedure that was performed on Wednesday, May 04, 2015. Pt stated, "toe throbbed in the middle of the night, but doing fine now".

## 2015-06-08 ENCOUNTER — Ambulatory Visit (INDEPENDENT_AMBULATORY_CARE_PROVIDER_SITE_OTHER): Payer: Medicare Other | Admitting: Internal Medicine

## 2015-06-08 ENCOUNTER — Encounter: Payer: Self-pay | Admitting: Internal Medicine

## 2015-06-08 VITALS — BP 152/82 | HR 71 | Temp 98.0°F | Resp 16 | Ht 62.0 in | Wt 235.0 lb

## 2015-06-08 DIAGNOSIS — R059 Cough, unspecified: Secondary | ICD-10-CM

## 2015-06-08 DIAGNOSIS — Z1159 Encounter for screening for other viral diseases: Secondary | ICD-10-CM | POA: Diagnosis not present

## 2015-06-08 DIAGNOSIS — Z6841 Body Mass Index (BMI) 40.0 and over, adult: Secondary | ICD-10-CM | POA: Diagnosis not present

## 2015-06-08 DIAGNOSIS — H04123 Dry eye syndrome of bilateral lacrimal glands: Secondary | ICD-10-CM

## 2015-06-08 DIAGNOSIS — F329 Major depressive disorder, single episode, unspecified: Secondary | ICD-10-CM

## 2015-06-08 DIAGNOSIS — F32A Depression, unspecified: Secondary | ICD-10-CM

## 2015-06-08 DIAGNOSIS — E785 Hyperlipidemia, unspecified: Secondary | ICD-10-CM

## 2015-06-08 DIAGNOSIS — M79675 Pain in left toe(s): Secondary | ICD-10-CM | POA: Diagnosis not present

## 2015-06-08 DIAGNOSIS — M461 Sacroiliitis, not elsewhere classified: Secondary | ICD-10-CM

## 2015-06-08 DIAGNOSIS — B351 Tinea unguium: Secondary | ICD-10-CM | POA: Diagnosis not present

## 2015-06-08 DIAGNOSIS — E119 Type 2 diabetes mellitus without complications: Secondary | ICD-10-CM

## 2015-06-08 DIAGNOSIS — Z0000 Encounter for general adult medical examination without abnormal findings: Secondary | ICD-10-CM

## 2015-06-08 DIAGNOSIS — E042 Nontoxic multinodular goiter: Secondary | ICD-10-CM | POA: Diagnosis not present

## 2015-06-08 DIAGNOSIS — I10 Essential (primary) hypertension: Secondary | ICD-10-CM | POA: Diagnosis not present

## 2015-06-08 DIAGNOSIS — Z23 Encounter for immunization: Secondary | ICD-10-CM | POA: Diagnosis not present

## 2015-06-08 DIAGNOSIS — Z1389 Encounter for screening for other disorder: Secondary | ICD-10-CM | POA: Diagnosis not present

## 2015-06-08 DIAGNOSIS — J302 Other seasonal allergic rhinitis: Secondary | ICD-10-CM | POA: Diagnosis not present

## 2015-06-08 DIAGNOSIS — R739 Hyperglycemia, unspecified: Secondary | ICD-10-CM | POA: Diagnosis not present

## 2015-06-08 DIAGNOSIS — R05 Cough: Secondary | ICD-10-CM

## 2015-06-08 LAB — COMPREHENSIVE METABOLIC PANEL
ALK PHOS: 51 U/L (ref 33–130)
ALT: 25 U/L (ref 6–29)
AST: 21 U/L (ref 10–35)
Albumin: 4.3 g/dL (ref 3.6–5.1)
BILIRUBIN TOTAL: 0.7 mg/dL (ref 0.2–1.2)
BUN: 22 mg/dL (ref 7–25)
CALCIUM: 9.3 mg/dL (ref 8.6–10.4)
CO2: 29 mmol/L (ref 20–31)
CREATININE: 1.33 mg/dL — AB (ref 0.60–0.93)
Chloride: 100 mmol/L (ref 98–110)
GLUCOSE: 212 mg/dL — AB (ref 65–99)
Potassium: 4.3 mmol/L (ref 3.5–5.3)
SODIUM: 138 mmol/L (ref 135–146)
Total Protein: 7.2 g/dL (ref 6.1–8.1)

## 2015-06-08 LAB — LIPID PANEL
CHOL/HDL RATIO: 3.9 ratio (ref <=5.0)
Cholesterol: 144 mg/dL (ref 125–200)
HDL: 37 mg/dL — AB (ref >=46)
LDL CALC: 43 mg/dL (ref <130)
Triglycerides: 322 mg/dL — ABNORMAL HIGH (ref <150)
VLDL: 64 mg/dL — AB (ref <30)

## 2015-06-08 LAB — CBC WITH DIFFERENTIAL/PLATELET
BASOS ABS: 0.1 10*3/uL (ref 0.0–0.1)
BASOS PCT: 1 % (ref 0–1)
EOS PCT: 3 % (ref 0–5)
Eosinophils Absolute: 0.2 10*3/uL (ref 0.0–0.7)
HEMATOCRIT: 39.8 % (ref 36.0–46.0)
HEMOGLOBIN: 13.4 g/dL (ref 12.0–15.0)
LYMPHS PCT: 19 % (ref 12–46)
Lymphs Abs: 1.5 10*3/uL (ref 0.7–4.0)
MCH: 32.1 pg (ref 26.0–34.0)
MCHC: 33.7 g/dL (ref 30.0–36.0)
MCV: 95.4 fL (ref 78.0–100.0)
MONO ABS: 0.4 10*3/uL (ref 0.1–1.0)
MPV: 9.9 fL (ref 8.6–12.4)
Monocytes Relative: 5 % (ref 3–12)
NEUTROS ABS: 5.6 10*3/uL (ref 1.7–7.7)
Neutrophils Relative %: 72 % (ref 43–77)
Platelets: 232 10*3/uL (ref 150–400)
RBC: 4.17 MIL/uL (ref 3.87–5.11)
RDW: 13.4 % (ref 11.5–15.5)
WBC: 7.8 10*3/uL (ref 4.0–10.5)

## 2015-06-08 LAB — TSH: TSH: 3.226 u[IU]/mL (ref 0.350–4.500)

## 2015-06-08 MED ORDER — BUPROPION HCL ER (XL) 300 MG PO TB24: ORAL_TABLET | ORAL | Status: DC

## 2015-06-08 MED ORDER — SERTRALINE HCL 100 MG PO TABS: ORAL_TABLET | ORAL | Status: DC

## 2015-06-08 MED ORDER — LEVOTHYROXINE SODIUM 150 MCG PO TABS: ORAL_TABLET | ORAL | Status: DC

## 2015-06-08 MED ORDER — ATORVASTATIN CALCIUM 40 MG PO TABS: ORAL_TABLET | ORAL | Status: AC

## 2015-06-08 MED ORDER — CARVEDILOL 6.25 MG PO TABS
ORAL_TABLET | ORAL | Status: DC
Start: 2015-06-08 — End: 2015-09-03

## 2015-06-08 MED ORDER — HYDROCODONE-ACETAMINOPHEN 5-325 MG PO TABS: ORAL_TABLET | ORAL | Status: AC

## 2015-06-08 MED ORDER — FLUTICASONE PROPIONATE 50 MCG/ACT NA SUSP: NASAL | Status: AC

## 2015-06-08 MED ORDER — ESCITALOPRAM OXALATE 20 MG PO TABS: 20.0000 mg | ORAL_TABLET | Freq: Every day | ORAL | Status: DC

## 2015-06-08 MED ORDER — METFORMIN HCL ER 500 MG PO TB24: ORAL_TABLET | ORAL | Status: DC

## 2015-06-08 MED ORDER — SPIRONOLACTONE 25 MG PO TABS: ORAL_TABLET | ORAL | Status: AC

## 2015-06-08 MED ORDER — LOSARTAN POTASSIUM-HCTZ 100-12.5 MG PO TABS: ORAL_TABLET | ORAL | Status: AC

## 2015-06-08 MED ORDER — CLONAZEPAM 0.5 MG PO TABS: ORAL_TABLET | ORAL | Status: DC

## 2015-06-08 NOTE — Patient Instructions (Addendum)
[facility] [place] [employer] [facility]

## 2015-06-08 NOTE — Progress Notes (Signed)
Subjective:    Patient ID: Alexis King, female    DOB: May 16, 1946, 69 y.o.   MRN: [id number]  HPI annual exam plus follow-up for medical problems Patient Active Problem List   Diagnosis Date Noted  . 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 She tried a lateral transfer within the [facility] system, but they could not find a place for her. She would prefer to continue to work.  02/25/2014  . Chronic cough--- this continues to be an issue that is unchanged. There is no associated night sweats, fever, weight loss, dyspnea on exertion and she has had a recent pulmonary evaluation suggests that this is reflux associated.  09/16/2013  . Abnormal chest x-ray 09/16/2013  . HTN (hypertension)--she is having terrible control seems worse she is stressed which she certainly is now.  10/22/2012  . Other and unspecified hyperlipidemia 10/22/2012  . Depression--see below  10/22/2012  . Multiple thyroid nodules--status post surgery  10/22/2012  . BMI 40.0-44.9, adult ([place]) 10/22/2012  . DM (diabetes mellitus) (HCC)---no probs as far as she knows //last a1c 7.1 Diet ok//no Chg foot sens 10/22/2012    depr worse cause of husband's cognitive problems and her role as caretaker-he explodes at everyone -All the children have issues with him although they are all out of the house and have 11 grandchildren total  Chr dry eye--[facility]--unsucc tx so far/adv cataracts as well possibly needing surgery [place] + no retinop 10/15  Recent toenail abl for fungus by pod twice in last 6 weeks-not well//still has lots of discharge from the toes which remain tender Hammer toe hurts and may need surgery  See meds-[facility] once every 2 weeks for back pain--dr kritzer has d/ced her from f/u--needs HC-will only use it at sleep if in trouble  Health maintenance issues -Mammogram  2016 and Pap smear 2012 and has GYN follow-up if needed -EKG 2015 normal -Referred for colonoscopy last year but never returned her phone calls for an appointment -Flu shot today -Tetanus today -Hep C screening today -Nonsmoker -Wears a seatbelt all the time -Very little alcohol and no other drug use -Very limited exercise -No falls last year -PHQ9 consistent with current interview   Review of Systems  Constitutional:       She continues with fatigue and sweating with activity Unfortunately she's had no weight loss there's been no weight gain either. No activity or appetite change.  HENT:       She continues with a postnasal drip secondary to allergic rhinitis with occasional sinus pressure but no recent infections. She denies trouble with hearing or trouble swallowing. She has no ongoing dental problems.  Eyes:       Noted in present illness  Respiratory:       Noted in present illness  Cardiovascular:       She has a history of palpitations and hypertension that has been intermittent is currently on medication and doing well. There is no change in her peripheral edema.  Gastrointestinal:       She has an abdominal pain pattern fits with IBS which is been diagnosed in the past /she has avoided colonoscopy by her own choice Her GERD is partially controlled with medication although she hates taking medication every day.  Endocrine:       She is on thyroid supplementation to suppress her multiple nodules. see  past history  Genitourinary:       She has urinary frequency and urgency but no stress incontinence and this symptom is variable.  Musculoskeletal:       In addition to her recent back surgery and chronic back pain she has left hip pain that affects her gait now. She is reluctant to go back for further orthopedic intervention. There is no history of hip injury. She also has neck pain and stiffness with upper thoracic area tightness that gets worse with seated posture or lifting.    Neurological:       She has no positive questions from the form  Hematological:       She developed recent problems with bruising easily with regular activity and discontinued her daily aspirin and this was resolved. She has no swollen lymph nodes.  Psychiatric/Behavioral:       She has problems as noted in the present illness with a sad mood, nervousness from stress, sleep disturbance from the same.   Family history-mother with heart disease and cancer. Father with diabetes heart disease high cholesterol and hypertension. Both had psychological problems, chiefly depression and both are dead     Objective:   Physical Exam  Constitutional: She is oriented to person, place, and time. She appears well-developed and well-nourished. No distress.  Very elevated BMI  HENT:  Head: Normocephalic.  Right Ear: External ear normal.  Left Ear: External ear normal.  Nose: Nose normal.  Mouth/Throat: Oropharynx is clear and moist.  Eyes: Conjunctivae and EOM are normal. Pupils are equal, round, and reactive to light.  Neck: Normal range of motion. Neck supple.  Thyroidectomy scar with no palpable nodules  Cardiovascular: Normal rate, regular rhythm, normal heart sounds and intact distal pulses.   No murmur heard. There are no carotid bruits  Pulmonary/Chest: Effort normal and breath sounds normal. She has no wheezes.  Breast exam normal bilaterally  Abdominal: Soft. Bowel sounds are normal. She exhibits no distension and no mass. There is no tenderness. There is no rebound.  No abdominal bruits  Musculoskeletal: Normal range of motion. She exhibits no edema or tenderness.  She has 59-day-old switch and then removed all of which are still exuding exudate thought secondary to use of phenol. Second left toe is more tender than the others and the exudate is more cloudy so this is cultured She has onychomycosis on remaining toes  She has a good range of motion of the left hip without pain except there  is significant tenderness to palpation over the left SI joint and pain with stressing that area. She has tightness in the lumbar region.  Lymphadenopathy:    She has no cervical adenopathy.  Neurological: She is alert and oriented to person, place, and time. She has normal reflexes. No cranial nerve deficit. Coordination normal.  Sensation intact both feet plantar aspects with good pulses Her cognitive assessment with mini-cog reveals her to be intact  Skin: Skin is warm and dry. No rash noted.  Psychiatric: She has a normal mood and affect. Her behavior is normal. Judgment and thought content normal.  Nursing note and vitals reviewed. BP 152/82 mmHg  Pulse 71  Temp(Src) 98 F (36.7 C)  Resp 16  Ht 5\' 2"  (1.575 m)  Wt 235 lb (106.595 kg)  BMI 42.97 kg/m2      Assessment & Plan:  Routine general medical examination at a health care facility -Need for immunization against influenza - Plan: Flu Vaccine QUAD 36+ mos IM (Fluarix) -  Need for Tdap vaccination - Plan: Tdap vaccine greater than or equal to 7yo IM -Need for hepatitis C screening test - Plan: Hepatitis C antibody    BMI 40.0-44.9, adult (HCC)  Essential hypertension - Plan: CBC with Differential/Platelet, Comprehensive metabolic panel -She is not controlled but not in favor of going up on her medicines -Needs weight loss -She will start checking home blood pressures to see if she is uncontrolled and will notify me within a month  Hyperlipidemia - Plan: Lipid panel/med refill if stable  Multiple thyroid nodules - Plan: TSH/med refill is stable  Type 2 diabetes mellitus without complication, without long-term current use of insulin (HCC) -Screening for nephropathy - Plan: Microalbumin, urine -Weight loss needed -Check creatinine  Cough -2 to gerd  Dry eye syndrome, bilateral/cataracts -Has ophthalmology care  Onychomycosis -Has podiatry follow-up  Pain in toe of left foot - Plan: Wound culture -To rule out  cellulitis post surgery//vigorous cleaning advised for all postsurgical drainage  Depression -We discussed the benefits of cognitive behavioral therapy and she was given the name of Dr. Pervis Hocking -Because she has been on Zoloft for many years this will be discontinued and replaced with Lexapro 20 mg and she will let me know the results in 6-8 weeks  Sacroiliitis (HCC)-a new finding post surgery for low back issues -Physical therapy referral offered/she will consider [facility]    PLAN -Once again we discussed the need for exercise and weight loss -FOBT from home(neg 2015)-she did not respond to appointment for colonoscopy last year-FOBT again this year

## 2015-06-09 LAB — HEPATITIS C ANTIBODY: HCV Ab: NEGATIVE

## 2015-06-09 LAB — HEMOGLOBIN A1C
Hgb A1c MFr Bld: 9.5 % — ABNORMAL HIGH (ref <5.7)
Mean Plasma Glucose: 226 mg/dL — ABNORMAL HIGH (ref <117)

## 2015-06-09 LAB — MICROALBUMIN, URINE: MICROALB UR: 4.9 mg/dL

## 2015-06-10 ENCOUNTER — Encounter: Payer: Self-pay | Admitting: Internal Medicine

## 2015-06-11 LAB — WOUND CULTURE
GRAM STAIN: NONE SEEN
Gram Stain: NONE SEEN

## 2015-06-12 ENCOUNTER — Other Ambulatory Visit: Payer: Self-pay | Admitting: Internal Medicine

## 2015-06-12 MED ORDER — DOXYCYCLINE HYCLATE 100 MG PO TABS: 100.0000 mg | ORAL_TABLET | Freq: Two times a day (BID) | ORAL | Status: DC

## 2015-06-13 ENCOUNTER — Encounter: Payer: Self-pay | Admitting: Internal Medicine

## 2015-06-14 ENCOUNTER — Encounter: Payer: Self-pay | Admitting: Internal Medicine

## 2015-06-14 MED ORDER — DOXYCYCLINE HYCLATE 100 MG PO TABS: 100.0000 mg | ORAL_TABLET | Freq: Two times a day (BID) | ORAL | Status: DC

## 2015-07-08 ENCOUNTER — Encounter: Payer: Self-pay | Admitting: Internal Medicine

## 2015-07-08 DIAGNOSIS — E119 Type 2 diabetes mellitus without complications: Secondary | ICD-10-CM | POA: Diagnosis not present

## 2015-07-13 DIAGNOSIS — L929 Granulomatous disorder of the skin and subcutaneous tissue, unspecified: Secondary | ICD-10-CM | POA: Diagnosis not present

## 2015-07-13 DIAGNOSIS — D1801 Hemangioma of skin and subcutaneous tissue: Secondary | ICD-10-CM | POA: Diagnosis not present

## 2015-07-13 DIAGNOSIS — L821 Other seborrheic keratosis: Secondary | ICD-10-CM | POA: Diagnosis not present

## 2015-07-13 DIAGNOSIS — Z85828 Personal history of other malignant neoplasm of skin: Secondary | ICD-10-CM | POA: Diagnosis not present

## 2015-07-13 DIAGNOSIS — D225 Melanocytic nevi of trunk: Secondary | ICD-10-CM | POA: Diagnosis not present

## 2015-07-18 ENCOUNTER — Encounter: Payer: Self-pay | Admitting: Internal Medicine

## 2015-07-20 ENCOUNTER — Ambulatory Visit (INDEPENDENT_AMBULATORY_CARE_PROVIDER_SITE_OTHER): Payer: Medicare Other | Admitting: Podiatry

## 2015-07-20 ENCOUNTER — Encounter: Payer: Self-pay | Admitting: Podiatry

## 2015-07-20 VITALS — BP 170/85 | HR 69 | Resp 16

## 2015-07-20 DIAGNOSIS — L03031 Cellulitis of right toe: Secondary | ICD-10-CM

## 2015-07-20 DIAGNOSIS — IMO0002 Reserved for concepts with insufficient information to code with codable children: Secondary | ICD-10-CM

## 2015-07-20 MED ORDER — DOXYCYCLINE HYCLATE 100 MG PO TABS: 100.0000 mg | ORAL_TABLET | Freq: Two times a day (BID) | ORAL | Status: DC

## 2015-07-20 NOTE — Progress Notes (Signed)
Subjective:     Patient ID: Alexis King, female   DOB: 1945/10/09, 69 y.o.   MRN: [id number]  HPI patient states I still have some redness around my big toes bilateral and I was diagnosed with MRSA and was on doxycycline for several weeks   Review of Systems     Objective:   Physical Exam Neurovascular status intact muscle strength adequate with mild redness just proximal to the nailbeds which had been removed several months ago from the hallux bilateral with the areas crusted know or odor or active drainage noted    Assessment:     Paronychia infection and cannot rule out MRSA infection    Plan:     Explained all this to her and at this point we will continue her doxycycline for another 3 weeks and I advised that she should continue soaks and bandage usage. Reappoint if symptoms persist

## 2015-07-20 NOTE — Patient Instructions (Signed)

## 2015-07-29 DIAGNOSIS — M25552 Pain in left hip: Secondary | ICD-10-CM | POA: Diagnosis not present

## 2015-09-03 ENCOUNTER — Encounter: Payer: Self-pay | Admitting: Internal Medicine

## 2015-09-03 MED ORDER — CARVEDILOL 6.25 MG PO TABS: ORAL_TABLET | ORAL | Status: AC

## 2015-09-03 MED ORDER — METFORMIN HCL ER 500 MG PO TB24: ORAL_TABLET | ORAL | Status: DC

## 2015-09-03 NOTE — Telephone Encounter (Signed)
Meds ordered this encounter  Medications  . carvedilol (COREG) 6.25 MG tablet    Sig: Take 1 tablet by mouth two  times daily with a meal    Dispense:  180 tablet    Refill:  3  . metFORMIN (GLUCOPHAGE-XR) 500 MG 24 hr tablet    Sig: Take 1 tablet by mouth two  times daily    Dispense:  180 tablet    Refill:  3   Sent to mail order

## 2015-09-09 ENCOUNTER — Emergency Department (HOSPITAL_COMMUNITY)
Admission: EM | Admit: 2015-09-09 | Discharge: 2015-09-09 | Disposition: A | Discharge status: Home / Self Care | Payer: Medicare Other | Attending: Emergency Medicine | Admitting: Emergency Medicine

## 2015-09-09 DIAGNOSIS — Z85828 Personal history of other malignant neoplasm of skin: Secondary | ICD-10-CM | POA: Diagnosis not present

## 2015-09-09 DIAGNOSIS — W01198A Fall on same level from slipping, tripping and stumbling with subsequent striking against other object, initial encounter: Secondary | ICD-10-CM | POA: Insufficient documentation

## 2015-09-09 DIAGNOSIS — M1990 Unspecified osteoarthritis, unspecified site: Secondary | ICD-10-CM | POA: Diagnosis not present

## 2015-09-09 DIAGNOSIS — F329 Major depressive disorder, single episode, unspecified: Secondary | ICD-10-CM | POA: Diagnosis not present

## 2015-09-09 DIAGNOSIS — Y9389 Activity, other specified: Secondary | ICD-10-CM | POA: Insufficient documentation

## 2015-09-09 DIAGNOSIS — E785 Hyperlipidemia, unspecified: Secondary | ICD-10-CM | POA: Diagnosis not present

## 2015-09-09 DIAGNOSIS — Y9289 Other specified places as the place of occurrence of the external cause: Secondary | ICD-10-CM | POA: Insufficient documentation

## 2015-09-09 DIAGNOSIS — Y998 Other external cause status: Secondary | ICD-10-CM | POA: Diagnosis not present

## 2015-09-09 DIAGNOSIS — F419 Anxiety disorder, unspecified: Secondary | ICD-10-CM | POA: Diagnosis not present

## 2015-09-09 DIAGNOSIS — Z7984 Long term (current) use of oral hypoglycemic drugs: Secondary | ICD-10-CM | POA: Insufficient documentation

## 2015-09-09 DIAGNOSIS — E119 Type 2 diabetes mellitus without complications: Secondary | ICD-10-CM | POA: Diagnosis not present

## 2015-09-09 DIAGNOSIS — Z7951 Long term (current) use of inhaled steroids: Secondary | ICD-10-CM | POA: Insufficient documentation

## 2015-09-09 DIAGNOSIS — S0181XA Laceration without foreign body of other part of head, initial encounter: Secondary | ICD-10-CM | POA: Diagnosis not present

## 2015-09-09 DIAGNOSIS — S0083XA Contusion of other part of head, initial encounter: Secondary | ICD-10-CM

## 2015-09-09 DIAGNOSIS — Z8701 Personal history of pneumonia (recurrent): Secondary | ICD-10-CM | POA: Insufficient documentation

## 2015-09-09 DIAGNOSIS — I2510 Atherosclerotic heart disease of native coronary artery without angina pectoris: Secondary | ICD-10-CM | POA: Insufficient documentation

## 2015-09-09 DIAGNOSIS — Z8669 Personal history of other diseases of the nervous system and sense organs: Secondary | ICD-10-CM | POA: Diagnosis not present

## 2015-09-09 DIAGNOSIS — Z792 Long term (current) use of antibiotics: Secondary | ICD-10-CM | POA: Diagnosis not present

## 2015-09-09 DIAGNOSIS — I10 Essential (primary) hypertension: Secondary | ICD-10-CM | POA: Insufficient documentation

## 2015-09-09 DIAGNOSIS — Z79899 Other long term (current) drug therapy: Secondary | ICD-10-CM | POA: Insufficient documentation

## 2015-09-09 DIAGNOSIS — Z7982 Long term (current) use of aspirin: Secondary | ICD-10-CM | POA: Insufficient documentation

## 2015-09-09 DIAGNOSIS — E039 Hypothyroidism, unspecified: Secondary | ICD-10-CM | POA: Insufficient documentation

## 2015-09-09 DIAGNOSIS — Z8719 Personal history of other diseases of the digestive system: Secondary | ICD-10-CM | POA: Insufficient documentation

## 2015-09-09 DIAGNOSIS — S4991XA Unspecified injury of right shoulder and upper arm, initial encounter: Secondary | ICD-10-CM | POA: Insufficient documentation

## 2015-09-09 DIAGNOSIS — Z791 Long term (current) use of non-steroidal anti-inflammatories (NSAID): Secondary | ICD-10-CM | POA: Diagnosis not present

## 2015-09-09 NOTE — ED Notes (Signed)
Ambulated in hall without difficulty.   

## 2015-09-09 NOTE — Discharge Instructions (Signed)
Facial Laceration ° A facial laceration is a cut on the face. These injuries can be painful and cause bleeding. Lacerations usually heal quickly, but they need special care to reduce scarring. °DIAGNOSIS  °Your health care provider will take a medical history, ask for details about how the injury occurred, and examine the wound to determine how deep the cut is. °TREATMENT  °Some facial lacerations may not require closure. Others may not be able to be closed because of an increased risk of infection. The risk of infection and the chance for successful closure will depend on various factors, including the amount of time since the injury occurred. °The wound may be cleaned to help prevent infection. If closure is appropriate, pain medicines may be given if needed. Your health care provider will use stitches (sutures), wound glue (adhesive), or skin adhesive strips to repair the laceration. These tools bring the skin edges together to allow for faster healing and a better cosmetic outcome. If needed, you may also be given a tetanus shot. °HOME CARE INSTRUCTIONS °· Only take over-the-counter or prescription medicines as directed by your health care provider. °· Follow your health care provider's instructions for wound care. These instructions will vary depending on the technique used for closing the wound. °For Sutures: °· Keep the wound clean and dry.   °· If you were given a bandage (dressing), you should change it at least once a day. Also change the dressing if it becomes wet or dirty, or as directed by your health care provider.   °· Wash the wound with soap and water 2 times a day. Rinse the wound off with water to remove all soap. Pat the wound dry with a clean towel.   °· After cleaning, apply a thin layer of the antibiotic ointment recommended by your health care provider. This will help prevent infection and keep the dressing from sticking.   °· You may shower as usual after the first 24 hours. Do not soak the  wound in water until the sutures are removed.   °· Get your sutures removed as directed by your health care provider. With facial lacerations, sutures should usually be taken out after 4-5 days to avoid stitch marks.   °· Wait a few days after your sutures are removed before applying any makeup. °For Skin Adhesive Strips: °· Keep the wound clean and dry.   °· Do not get the skin adhesive strips wet. You may bathe carefully, using caution to keep the wound dry.   °· If the wound gets wet, pat it dry with a clean towel.   °· Skin adhesive strips will fall off on their own. You may trim the strips as the wound heals. Do not remove skin adhesive strips that are still stuck to the wound. They will fall off in time.   °For Wound Adhesive: °· You may briefly wet your wound in the shower or bath. Do not soak or scrub the wound. Do not swim. Avoid periods of heavy sweating until the skin adhesive has fallen off on its own. After showering or bathing, gently pat the wound dry with a clean towel.   °· Do not apply liquid medicine, cream medicine, ointment medicine, or makeup to your wound while the skin adhesive is in place. This may loosen the film before your wound is healed.   °· If a dressing is placed over the wound, be careful not to apply tape directly over the skin adhesive. This may cause the adhesive to be pulled off before the wound is healed.   °· Avoid   not to apply tape directly over the skin adhesive. This may cause the adhesive to be pulled off before the wound is healed.    Avoid prolonged exposure to sunlight or tanning lamps while the skin adhesive is in place.   The skin adhesive will usually remain in place for 5-10 days, then naturally fall off the skin. Do not pick at the adhesive film.   After Healing:  Once the wound has healed, cover the wound with sunscreen during the day for 1 full year. This can help minimize scarring. Exposure to ultraviolet light in the first year will darken the scar. It can take 1-2 years for the scar to lose its redness and to heal completely.   SEEK MEDICAL CARE IF:   You have a fever.  SEEK IMMEDIATE  MEDICAL CARE IF:   You have redness, pain, or swelling around the wound.    You see ayellowish-white fluid (pus) coming from the wound.      This information is not intended to replace advice given to you by your health care provider. Make sure you discuss any questions you have with your health care provider.     Document Released: 09/13/2004 Document Revised: 08/27/2014 Document Reviewed: 03/19/2013  Elsevier Interactive Patient Education 2016 [employer]  Head Injury, Adult  You have received a head injury. It does not appear serious at this time. Headaches and vomiting are common following head injury. It should be easy to awaken from sleeping. Sometimes it is necessary for you to stay in the emergency department for a while for observation. Sometimes admission to the hospital may be needed. After injuries such as yours, most problems occur within the first 24 hours, but side effects may occur up to 7-10 days after the injury. It is important for you to carefully monitor your condition and contact your health care provider or seek immediate medical care if there is a change in your condition.  WHAT ARE THE TYPES OF HEAD INJURIES?  Head injuries can be as minor as a bump. Some head injuries can be more severe. More severe head injuries include:   A jarring injury to the brain (concussion).   A bruise of the brain (contusion). This mean there is bleeding in the brain that can cause swelling.   A cracked skull (skull fracture).   Bleeding in the brain that collects, clots, and forms a bump (hematoma).  WHAT CAUSES A HEAD INJURY?  A serious head injury is most likely to happen to someone who is in a car wreck and is not wearing a seat belt. Other causes of major head injuries include bicycle or motorcycle accidents, sports injuries, and falls.  HOW ARE HEAD INJURIES DIAGNOSED?  A complete history of the event leading to the injury and your current symptoms will be helpful in diagnosing head injuries.  Many times, pictures of the brain, such as CT or MRI are needed to see the extent of the injury. Often, an overnight hospital stay is necessary for observation.   WHEN SHOULD I SEEK IMMEDIATE MEDICAL CARE?   You should get help right away if:   You have confusion or drowsiness.   You feel sick to your stomach (nauseous) or have continued, forceful vomiting.   You have dizziness or unsteadiness that is getting worse.   You have severe, continued headaches not relieved by medicine. Only take over-the-counter or prescription medicines for pain, fever, or discomfort as directed by your health care provider.   You do   not have normal function of the arms or legs or are unable to walk.   You notice changes in the black spots in the center of the colored part of your eye (pupil).   You have a clear or bloody fluid coming from your nose or ears.   You have a loss of vision.  During the next 24 hours after the injury, you must stay with someone who can watch you for the warning signs. This person should contact local emergency services (911 in the [place] if you have seizures, you become unconscious, or you are unable to wake up.  HOW CAN I PREVENT A HEAD INJURY IN THE FUTURE?  The most important factor for preventing major head injuries is avoiding motor vehicle accidents. To minimize the potential for damage to your head, it is crucial to wear seat belts while riding in motor vehicles. Wearing helmets while bike riding and playing collision sports (like football) is also helpful. Also, avoiding dangerous activities around the house will further help reduce your risk of head injury.   WHEN CAN I RETURN TO NORMAL ACTIVITIES AND ATHLETICS?  You should be reevaluated by your health care provider before returning to these activities. If you have any of the following symptoms, you should not return to activities or contact sports until 1 week after the symptoms have stopped:   Persistent headache.   Dizziness or  vertigo.   Poor attention and concentration.   Confusion.   Memory problems.   Nausea or vomiting.   Fatigue or tire easily.   Irritability.   Intolerant of bright lights or loud noises.   Anxiety or depression.   Disturbed sleep.  MAKE SURE YOU:    Understand these instructions.   Will watch your condition.   Will get help right away if you are not doing well or get worse.     This information is not intended to replace advice given to you by your health care provider. Make sure you discuss any questions you have with your health care provider.     Document Released: 08/06/2005 Document Revised: 08/27/2014 Document Reviewed: 04/13/2013  Elsevier Interactive Patient Education 2016 [employer]

## 2015-09-09 NOTE — ED Notes (Signed)
Patient slipped and fell in the main lobby striking her head and right shoulder.  Hematoma and laceration noted on her forehead. Ice applied to forehead and right shoulder

## 2015-09-09 NOTE — ED Provider Notes (Signed)
CSN: [id number]     Arrival date & time 09/09/15  1005 History   First MD Initiated Contact with Patient 09/09/15 1035     Chief Complaint  Patient presents with  . Fall     (Consider location/radiation/quality/duration/timing/severity/associated sxs/prior Treatment) Patient is a 70 y.o. female presenting with head injury. The history is provided by the patient.  Head Injury Location:  Frontal Time since incident:  20 minutes Mechanism of injury: fall   Mechanism of injury comment:  Tripped while walking Pain details:    Quality:  Aching   Severity:  Mild   Timing:  Constant   Progression:  Unchanged Chronicity:  New Relieved by:  Nothing Worsened by:  Nothing tried Ineffective treatments:  None tried Associated symptoms: headache   Associated symptoms: no blurred vision, no focal weakness, no loss of consciousness, no memory loss, no nausea, no neck pain and no vomiting   Associated symptoms comment:  Right shoulder stiffness   Past Medical History  Diagnosis Date  . Hypertension   . Hyperlipidemia   . Depression   . Diabetes mellitus   . IBS (irritable bowel syndrome)   . Basal cell carcinoma of skin   . Coronary artery disease   . Dysrhythmia   . Pneumonia     early this year  . H/O hiatal hernia     otc  . GERD (gastroesophageal reflux disease)   . Arthritis   . Hypothyroidism   . Anxiety   . Cataract    Past Surgical History  Procedure Laterality Date  . Cholecystectomy    . Cesarean section    . Lacrimal duct probing    . Basal cell carcinoma excision    . Thyroidectomy, partial    . Dental surgery      will have dental surgery 02/24/14, Root canal  . Root canal  02-24-14  . Anterior lat lumbar fusion N/A 02/25/2014    Procedure: ANTERIOR LATERAL LUMBAR FUSION 3 LEVELS;  Surgeon: Faythe Ghee, MD;  Location: [facility];  Service: Neurosurgery;  Laterality: N/A;  Lumbar One-Three Lateral Interbody  . Lumbar percutaneous pedicle screw 3 level  02/25/2014     Procedure: LUMBAR PERCUTANEOUS PEDICLE SCREW LUMBAR TWO-THREE,LUMBAR THREE-FOUR,LUMBAR FOUR-FIVE;  Surgeon: Faythe Ghee, MD;  Location: [facility];  Service: Neurosurgery;;  . Transforaminal lumbar interbody fusion (tlif) with pedicle screw fixation 1 level  02/25/2014    Procedure: TRANSFORAMINAL LUMBAR INTERBODY FUSION (TLIF) WITH PEDICLE SCREW FIXATION 1 LEVEL;  Surgeon: Faythe Ghee, MD;  Location: [facility];  Service: Neurosurgery;;  Transforaminal Lumbar Four-Five Interbody  . Cosmetic surgery     Family History  Problem Relation Age of Onset  . Heart disease Father   . Skin cancer Father   . Diabetes Father   . Hyperlipidemia Father   . Hypertension Father   . Arthritis Mother   . Pulmonary fibrosis Mother   . Skin cancer Mother    Social History  Substance Use Topics  . Smoking status: Never Smoker   . Smokeless tobacco: Never Used  . Alcohol Use: Yes     Comment: rare   OB History    No data available     Review of Systems  Eyes: Negative for blurred vision.  Gastrointestinal: Negative for nausea and vomiting.  Musculoskeletal: Negative for neck pain.  Neurological: Positive for headaches. Negative for focal weakness and loss of consciousness.  Psychiatric/Behavioral: Negative for memory loss.  All other systems reviewed and  are negative.     Allergies  Ace inhibitors; Clindamycin/lincomycin; Levaquin; Meloxicam; and Sulfa antibiotics  Home Medications   Prior to Admission medications   Medication Sig Start Date End Date Taking? Authorizing Provider  aspirin 81 MG tablet Take 81 mg by mouth daily.    Historical Provider, MD  atorvastatin (LIPITOR) 40 MG tablet Take 1 tablet by mouth at  bedtime 06/08/15   Leandrew Koyanagi, MD  Biotin 10 MG CAPS Take 1 tablet by mouth daily.    Historical Provider, MD  buPROPion (WELLBUTRIN XL) 300 MG 24 hr tablet Take 1 tablet by mouth  daily 06/08/15   Leandrew Koyanagi, MD  Calcium-Magnesium-Vitamin D  (CALCIUM MAGNESIUM PO) Take 1 tablet by mouth daily.    Historical Provider, MD  carvedilol (COREG) 6.25 MG tablet Take 1 tablet by mouth two  times daily with a meal 09/03/15   Leandrew Koyanagi, MD  cholecalciferol (VITAMIN D) 1000 UNITS tablet Take 1,000 Units by mouth daily.    Historical Provider, MD  clonazePAM Bobbye [place]) 0.5 MG tablet Take one tablet by mouth at bedtime as needed 06/08/15   Leandrew Koyanagi, MD  cycloSPORINE (RESTASIS) 0.05 % ophthalmic emulsion Place 1 drop into both eyes 2 (two) times daily.    Historical Provider, MD  doxycycline (VIBRA-TABS) 100 MG tablet Take 1 tablet (100 mg total) by mouth 2 (two) times daily. 06/12/15   Leandrew Koyanagi, MD  doxycycline (VIBRA-TABS) 100 MG tablet Take 1 tablet (100 mg total) by mouth 2 (two) times daily. 06/14/15   Leandrew Koyanagi, MD  doxycycline (VIBRA-TABS) 100 MG tablet Take 1 tablet (100 mg total) by mouth 2 (two) times daily. 07/20/15   Wallene Huh, DPM  escitalopram (LEXAPRO) 20 MG tablet Take 1 tablet (20 mg total) by mouth daily. 06/08/15   Leandrew Koyanagi, MD  fluticasone Asencion Islam) 50 MCG/ACT nasal spray Use 2 sprays nasally daily each nostril 06/08/15   Leandrew Koyanagi, MD  HYDROcodone-acetaminophen (NORCO/VICODIN) 5-325 MG tablet Take one tablet by mouth hs as needed for pain 06/08/15   Leandrew Koyanagi, MD  levothyroxine (SYNTHROID, LEVOTHROID) 150 MCG tablet Take 1 tablet by mouth  daily 06/08/15   Leandrew Koyanagi, MD  losartan-hydrochlorothiazide [facility]) 100-12.5 MG tablet Take 1 tablet by mouth  daily 06/08/15   Leandrew Koyanagi, MD  metFORMIN (GLUCOPHAGE-XR) 500 MG 24 hr tablet Take 1 tablet by mouth two  times daily 09/03/15   Leandrew Koyanagi, MD  Multiple Vitamin (MULTIVITAMIN) capsule Take 1 capsule by mouth daily.    Historical Provider, MD  naproxen sodium (ANAPROX) 220 MG tablet Take 220 mg by mouth 2 (two) times daily with a meal. 220 mg q am, 440mg  q HS    Historical Provider, MD    neomycin-polymyxin-hydrocortisone (CORTISPORIN) otic solution Apply 1-2 drops to toe after soaking BID 05/04/15   Tamala Fothergill Regal, DPM  Propylene Glycol (SYSTANE BALANCE) 0.6 % SOLN Apply 1 drop to eye daily as needed.    Historical Provider, MD  RA KRILL OIL 500 MG CAPS Take 1 capsule by mouth daily.    Historical Provider, MD  spironolactone (ALDACTONE) 25 MG tablet Take 1 tablet by mouth  daily 06/08/15   Leandrew Koyanagi, MD  vitamin C (ASCORBIC ACID) 500 MG tablet Take 500 mg by mouth daily.    Historical Provider, MD   BP 132/79 mmHg  Pulse 69  Temp(Src) 97.9 F (36.6 C) (Oral)  Resp 18  Ht  5' 2.5" (1.588 m)  Wt 230 lb (104.327 kg)  BMI 41.37 kg/m2  SpO2 96% Physical Exam  Constitutional: She is oriented to person, place, and time. She appears well-developed and well-nourished. No distress.  HENT:  Head: Normocephalic. Head is with contusion and with laceration (1 cm).    Eyes: Conjunctivae are normal.  Neck: Neck supple. No tracheal deviation present.  Cardiovascular: Normal rate, regular rhythm and normal heart sounds.   Pulmonary/Chest: Effort normal and breath sounds normal. No respiratory distress.  Abdominal: Soft. She exhibits no distension.  Musculoskeletal:       Right shoulder: She exhibits pain. She exhibits normal range of motion, no tenderness, no swelling, no effusion, no crepitus and no deformity.  Neurological: She is alert and oriented to person, place, and time. She has normal strength. No cranial nerve deficit. Coordination and gait normal. GCS eye subscore is 4. GCS verbal subscore is 5. GCS motor subscore is 6.  Skin: Skin is warm and dry.  Psychiatric: She has a normal mood and affect.    ED Course  Procedures (including critical care time)  LACERATION REPAIR Performed by: Leo Grosser Authorized by: Leo Grosser Consent: Verbal consent obtained. Risks and benefits: risks, benefits and alternatives were discussed Consent given by:  patient Patient identity confirmed: provided demographic data Prepped and Draped in normal sterile fashion Wound explored  Laceration Location: forehead  Laceration Length: 1cm  No Foreign Bodies seen or palpated  Anesthesia: none  Amount of cleaning: standard  Skin closure: glue  Number of sutures: n/a  Technique: simple  Patient tolerance: Patient tolerated the procedure well with no immediate complications.   Labs Review Labs Reviewed - No data to display  Imaging Review No results found. I have personally reviewed and evaluated these images and lab results as part of my medical decision-making.   EKG Interpretation None      MDM   Final diagnoses:  Forehead laceration, initial encounter  Traumatic hematoma of forehead, initial encounter    70 y.o. female presents with tripping and falling from standing striking her forehead on a column and sustaining a small laceration and underlying hematoma. No LOC, no deficits on arrival, not anticoagulated or on antiplatelet meds. No indication for imaging. Pt ambulatory without difficulty. Plan to follow up with PCP as needed and return precautions discussed for worsening or new concerning symptoms.     Leo Grosser, MD 09/09/15 [phone]

## 2015-09-13 ENCOUNTER — Ambulatory Visit (INDEPENDENT_AMBULATORY_CARE_PROVIDER_SITE_OTHER): Payer: Medicare Other | Admitting: Family Medicine

## 2015-09-13 VITALS — BP 122/74 | HR 84 | Temp 97.9°F | Resp 17 | Ht 61.5 in | Wt 230.0 lb

## 2015-09-13 DIAGNOSIS — S0003XD Contusion of scalp, subsequent encounter: Secondary | ICD-10-CM

## 2015-09-13 DIAGNOSIS — K0889 Other specified disorders of teeth and supporting structures: Secondary | ICD-10-CM | POA: Diagnosis not present

## 2015-09-13 DIAGNOSIS — S0100XD Unspecified open wound of scalp, subsequent encounter: Secondary | ICD-10-CM

## 2015-09-13 DIAGNOSIS — J3489 Other specified disorders of nose and nasal sinuses: Secondary | ICD-10-CM

## 2015-09-13 DIAGNOSIS — S29012A Strain of muscle and tendon of back wall of thorax, initial encounter: Secondary | ICD-10-CM | POA: Diagnosis not present

## 2015-09-13 NOTE — Patient Instructions (Addendum)
Follow up with your regular orthopaedic provider for your back pain.  Follow up with your dentist for evaluation of tooth pain from fall.   You could have a nasal fracture, so let me know if you change your mind about the cat scan of the face bones.  If pain in nose is not improving into next week - recommend follow up with Ear, nose and throat physician.   You may have pulled the "lat" muscle on your side, but if any change or new areas of back pain, return for possible xrays. Heat or ice to affected area and range of motion as needed.   Return to the clinic or go to the nearest emergency room if any of your symptoms worsen or new symptoms occur.  Contusion A contusion is a deep bruise. Contusions are the result of a blunt injury to tissues and muscle fibers under the skin. The injury causes bleeding under the skin. The skin overlying the contusion may turn blue, purple, or yellow. Minor injuries will give you a painless contusion, but more severe contusions may stay painful and swollen for a few weeks.  CAUSES  This condition is usually caused by a blow, trauma, or direct force to an area of the body. SYMPTOMS  Symptoms of this condition include:  Swelling of the injured area.  Pain and tenderness in the injured area.  Discoloration. The area may have redness and then turn blue, purple, or yellow. DIAGNOSIS  This condition is diagnosed based on a physical exam and medical history. An X-ray, CT scan, or MRI may be needed to determine if there are any associated injuries, such as broken bones (fractures). TREATMENT  Specific treatment for this condition depends on what area of the body was injured. In general, the best treatment for a contusion is resting, icing, applying pressure to (compression), and elevating the injured area. This is often called the RICE strategy. Over-the-counter anti-inflammatory medicines may also be recommended for pain control.  HOME CARE INSTRUCTIONS   Rest the  injured area.  If directed, apply ice to the injured area:  Put ice in a plastic bag.  Place a towel between your skin and the bag.  Leave the ice on for 20 minutes, 2-3 times per day.  If directed, apply light compression to the injured area using an elastic bandage. Make sure the bandage is not wrapped too tightly. Remove and reapply the bandage as directed by your health care provider.  If possible, raise (elevate) the injured area above the level of your heart while you are sitting or lying down.  Take over-the-counter and prescription medicines only as told by your health care provider. SEEK MEDICAL CARE IF:  Your symptoms do not improve after several days of treatment.  Your symptoms get worse.  You have difficulty moving the injured area. SEEK IMMEDIATE MEDICAL CARE IF:   You have severe pain.  You have numbness in a hand or foot.  Your hand or foot turns pale or cold.   This information is not intended to replace advice given to you by your health care provider. Make sure you discuss any questions you have with your health care provider.   Document Released: 05/16/2005 Document Revised: 04/27/2015 Document Reviewed: 12/22/2014 Elsevier Interactive Patient Education [employer].

## 2015-09-13 NOTE — Progress Notes (Signed)
Subjective:  This chart was scribed for  Merri Ray MD,  by Tamsen Roers, at [facility].  This patient was seen in room  13 and the patient's care was started at 4:22 PM.     Patient ID: Alexis King, female    DOB: 07-21-1946, 70 y.o.   MRN: [id number] Chief Complaint  Patient presents with  . Hospitalization Follow-up    fall last wednes  . Facial Injury    HPI HPI Comments: Alexis King is a 70 y.o. female who presents to the [facility] for a follow up. Alexis King is a patient of Dr. Ninfa Meeker.   She was seen 4 days ago in the ED for a fall with a forehead laceration and a hematoma of her forehead.  She tripped and fell.  There was no indication for imaging. Appears that derma bond was used for 1 cm laceration repair.  --- Since patients lumbar surgery 18 months ago, she has had difficulty walking long distances.  Patient slammed into a marble pillar at home while leaving to pick up her husband from the hospital.   Patient hit her nose as well as her forehead and thinks that she may have broken it .  She stopped blowing out blood clots and states that the pain in her nose  has not changed.  She has associated symptoms of a mild headache and pain in her lower tooth (denies any loose feeling).    She denies any difficulty breathing, vision changes.   Patients back specialist discharged her and she has an orthopedic doctor who gave her a cortisone injection.  She will call her dentist regarding her tooth.    Before exiting the room, patient also expressed that she is having soreness below her rib cage with left sided sharp pain radiating to her back while laying down.  This symptom started after her fall.  She denies any bruising to the area and feels like the pain is on the "surface".  She states that she has found a way to lay down without having abdominal pain.       Patient Active Problem List   Diagnosis Date Noted    . Hypotension 03/01/2014  . Dehydration with hyponatremia 03/01/2014  . Acute respiratory failure with hypoxia ([place]) 03/01/2014  . Spondylolisthesis of lumbar region 02/25/2014  . Chronic cough 09/16/2013  . Abnormal chest x-ray 09/16/2013  . HTN (hypertension) 10/22/2012  . Other and unspecified hyperlipidemia 10/22/2012  . Depression 10/22/2012  . Multiple thyroid nodules 10/22/2012  . BMI 40.0-44.9, adult ([place]) 10/22/2012  . Unspecified hypothyroidism 10/22/2012  . DM (diabetes mellitus) ([place]) 10/22/2012  . Lumbar spondylosis with myelopathy 10/22/2012   Past Medical History  Diagnosis Date  . Hypertension   . Hyperlipidemia   . Depression   . Diabetes mellitus   . IBS (irritable bowel syndrome)   . Basal cell carcinoma of skin   . Coronary artery disease   . Dysrhythmia   . Pneumonia     early this year  . H/O hiatal hernia     otc  . GERD (gastroesophageal reflux disease)   . Arthritis   . Hypothyroidism   . Anxiety   . Cataract    Past Surgical History  Procedure Laterality Date  . Cholecystectomy    . Cesarean section    . Lacrimal duct probing    . Basal cell carcinoma excision    . Thyroidectomy, partial    .  Dental surgery      will have dental surgery 02/24/14, Root canal  . Root canal  02-24-14  . Anterior lat lumbar fusion N/A 02/25/2014    Procedure: ANTERIOR LATERAL LUMBAR FUSION 3 LEVELS;  Surgeon: Faythe Ghee, MD;  Location: [facility];  Service: Neurosurgery;  Laterality: N/A;  Lumbar One-Three Lateral Interbody  . Lumbar percutaneous pedicle screw 3 level  02/25/2014    Procedure: LUMBAR PERCUTANEOUS PEDICLE SCREW LUMBAR TWO-THREE,LUMBAR THREE-FOUR,LUMBAR FOUR-FIVE;  Surgeon: Faythe Ghee, MD;  Location: [facility];  Service: Neurosurgery;;  . Transforaminal lumbar interbody fusion (tlif) with pedicle screw fixation 1 level  02/25/2014    Procedure: TRANSFORAMINAL LUMBAR INTERBODY FUSION (TLIF) WITH PEDICLE SCREW FIXATION 1 LEVEL;  Surgeon:  Faythe Ghee, MD;  Location: [facility];  Service: Neurosurgery;;  Transforaminal Lumbar Four-Five Interbody  . Cosmetic surgery     Allergies  Allergen Reactions  . Ace Inhibitors Cough  . Clindamycin/Lincomycin     Severe stomach cramps  . Levaquin [Levofloxacin In D5w] Other (See Comments)    Achillis tendon pain  . Meloxicam Nausea Only    Stomach Cramps  . Tape Other (See Comments)    Redness, Pulls skin off, Please use "paper" tape  . Sulfa Antibiotics Rash   Prior to Admission medications   Medication Sig Start Date End Date Taking? Authorizing Provider  atorvastatin (LIPITOR) 40 MG tablet Take 1 tablet by mouth at  bedtime 06/08/15  Yes Leandrew Koyanagi, MD  Biotin 10 MG CAPS Take 1 tablet by mouth daily.   Yes Historical Provider, MD  buPROPion (WELLBUTRIN XL) 300 MG 24 hr tablet Take 1 tablet by mouth  daily 06/08/15  Yes Leandrew Koyanagi, MD  Calcium-Magnesium-Vitamin D (CALCIUM MAGNESIUM PO) Take 1 tablet by mouth at bedtime.    Yes Historical Provider, MD  carvedilol (COREG) 6.25 MG tablet Take 1 tablet by mouth two  times daily with a meal Patient taking differently: Take 6.25 mg by mouth 2 (two) times daily with a meal. Take 1 tablet by mouth two  times daily with a meal 09/03/15  Yes Leandrew Koyanagi, MD  cholecalciferol (VITAMIN D) 1000 UNITS tablet Take 1,000 Units by mouth daily.   Yes Historical Provider, MD  clonazePAM (KLONOPIN) 0.5 MG tablet Take one tablet by mouth at bedtime as needed Patient taking differently: Take 0.5 mg by mouth at bedtime.  06/08/15  Yes Leandrew Koyanagi, MD  cycloSPORINE (RESTASIS) 0.05 % ophthalmic emulsion Place 1 drop into both eyes 2 (two) times daily.   Yes Historical Provider, MD  escitalopram (LEXAPRO) 20 MG tablet Take 1 tablet (20 mg total) by mouth daily. 06/08/15  Yes Leandrew Koyanagi, MD  fluticasone (FLONASE) 50 MCG/ACT nasal spray Use 2 sprays nasally daily each nostril Patient taking differently: Place 2 sprays  into both nostrils at bedtime.  06/08/15  Yes Leandrew Koyanagi, MD  HYDROcodone-acetaminophen (NORCO/VICODIN) 5-325 MG tablet Take one tablet by mouth hs as needed for pain 06/08/15  Yes Leandrew Koyanagi, MD  levothyroxine (SYNTHROID, LEVOTHROID) 150 MCG tablet Take 1 tablet by mouth  daily 06/08/15  Yes Leandrew Koyanagi, MD  losartan-hydrochlorothiazide (HYZAAR) 100-12.5 MG tablet Take 1 tablet by mouth  daily Patient taking differently: Take 1 tablet by mouth at bedtime.  06/08/15  Yes Leandrew Koyanagi, MD  metFORMIN (GLUCOPHAGE-XR) 500 MG 24 hr tablet Take 1 tablet by mouth two  times daily Patient taking differently: Take 1,000 mg by  mouth 2 (two) times daily. Take 1 tablet by mouth two  times daily 09/03/15  Yes Leandrew Koyanagi, MD  Multiple Vitamin (MULTIVITAMIN) capsule Take 1 capsule by mouth at bedtime.    Yes Historical Provider, MD  naproxen sodium (ANAPROX) 220 MG tablet Take 440 mg by mouth 2 (two) times daily with a meal.    Yes Historical Provider, MD  Propylene Glycol (SYSTANE BALANCE) 0.6 % SOLN Apply 1 drop to eye daily as needed (dry eyes).    Yes Historical Provider, MD  RA KRILL OIL 500 MG CAPS Take 1 capsule by mouth at bedtime.    Yes Historical Provider, MD  spironolactone (ALDACTONE) 25 MG tablet Take 1 tablet by mouth  daily Patient taking differently: Take 25 mg by mouth daily. Take 1 tablet by mouth  daily 06/08/15  Yes Leandrew Koyanagi, MD  vitamin C (ASCORBIC ACID) 500 MG tablet Take 500 mg by mouth daily.   Yes Historical Provider, MD   Social History   Social History  . Marital Status: Married    Spouse Name: N/A  . Number of Children: N/A  . Years of Education: N/A   Occupational History  . RN--part time [employer]   Social History Main Topics  . Smoking status: Never Smoker   . Smokeless tobacco: Never Used  . Alcohol Use: Yes     Comment: rare  . Drug Use: No  . Sexual Activity: Yes   Other Topics Concern  . Not on file   Social  History Narrative   Married. Education: [employer]. Exercise: No       Review of Systems  Constitutional: Negative for fever and chills.  HENT: Positive for dental problem.   Eyes: Negative for itching and visual disturbance.  Respiratory: Negative for cough, choking and shortness of breath.   Gastrointestinal: Positive for abdominal pain. Negative for nausea and vomiting.  Musculoskeletal: Positive for myalgias and back pain.  Skin: Positive for color change.  Neurological: Positive for headaches. Negative for seizures, syncope and speech difficulty.       Objective:   Physical Exam  Constitutional: No distress.  HENT:  Soft tissue swelling over right frontal sinus.  Right frontal forehead- wound with overlying glue, no discharge Right frontal scalp- ecchymosis of various stages, around right greater than left. Some righ frontal scalp swelling versus left.  Upper orbit non tender.  Orbit is non tender.  Maxillary sinus and jaw non tender Tenderness at the right side of the upper nose as well as diffusely across the nasal bone. No apparent displacement left side versus right.  Left central incisor crown tender without apparent intrusion or extrusion, no visible movement with palpation.  No bleeding from the base of the tooth, jaw or the gum line.  Nasal exam- No apparent septal hematoma or bleeding  Able to individually breathe out each nares.    Eyes: EOM are normal. Pupils are equal, round, and reactive to light. Right conjunctiva is not injected. Left conjunctiva is not injected. Right eye exhibits no nystagmus. Left eye exhibits no nystagmus.  Pulmonary/Chest: No respiratory distress.  Abdominal:  No focal abdominal tenderness, no focal rib tenderness.   Musculoskeletal:  No midline bony tenderness, lumbar spine- paraspinals are non tender.    Skin:  Skin is intact no ecchymosis.     Filed Vitals:   09/13/15 1411  BP: 122/74  Pulse: 84  Temp: 97.9 F (36.6 C)   TempSrc: Oral  Resp: 17  Height: 5'  1.5" (1.562 m)  Weight: 230 lb (104.327 kg)  SpO2: 96%       Assessment & Plan:  Alexis King is a 70 y.o. female Scalp contusion, subsequent encounter Open wound of scalp, subsequent encounter, Nasal pain  -Suspected soft tissue bruising with contusion. However with tenderness along the nasal bone and just laterally, recommended CT facial bones for further determination if fracture present. This was declined, but advised her to follow-up with me or other provider if pain persisted. Also can refer to ENT if she would like. See precautions if worsening.  Strain of latissimus dorsi muscle, initial encounter  -Basal location and late onset, suspect latissimus or oblique muscle strain.. No focal bony tenderness, no abdominal tenderness. Symptomatic care discussed,  gentle range of motion, RTC precautions.  Tooth pain  - No apparent intrusion/extrusion, but did advise her to follow-up with dentist as planned for further evaluation.  No orders of the defined types were placed in this encounter.   Patient Instructions  Follow up with your regular orthopaedic provider for your back pain.  Follow up with your dentist for evaluation of tooth pain from fall.   You could have a nasal fracture, so let me know if you change your mind about the cat scan of the face bones.  If pain in nose is not improving into next week - recommend follow up with Ear, nose and throat physician.   You may have pulled the "lat" muscle on your side, but if any change or new areas of back pain, return for possible xrays. Heat or ice to affected area and range of motion as needed.   Return to the clinic or go to the nearest emergency room if any of your symptoms worsen or new symptoms occur.  Contusion A contusion is a deep bruise. Contusions are the result of a blunt injury to tissues and muscle fibers under the skin. The injury causes bleeding under the skin. The skin  overlying the contusion may turn blue, purple, or yellow. Minor injuries will give you a painless contusion, but more severe contusions may stay painful and swollen for a few weeks.  CAUSES  This condition is usually caused by a blow, trauma, or direct force to an area of the body. SYMPTOMS  Symptoms of this condition include:  Swelling of the injured area.  Pain and tenderness in the injured area.  Discoloration. The area may have redness and then turn blue, purple, or yellow. DIAGNOSIS  This condition is diagnosed based on a physical exam and medical history. An X-ray, CT scan, or MRI may be needed to determine if there are any associated injuries, such as broken bones (fractures). TREATMENT  Specific treatment for this condition depends on what area of the body was injured. In general, the best treatment for a contusion is resting, icing, applying pressure to (compression), and elevating the injured area. This is often called the RICE strategy. Over-the-counter anti-inflammatory medicines may also be recommended for pain control.  HOME CARE INSTRUCTIONS   Rest the injured area.  If directed, apply ice to the injured area:  Put ice in a plastic bag.  Place a towel between your skin and the bag.  Leave the ice on for 20 minutes, 2-3 times per day.  If directed, apply light compression to the injured area using an elastic bandage. Make sure the bandage is not wrapped too tightly. Remove and reapply the bandage as directed by your health care provider.  If possible, raise (  elevate) the injured area above the level of your heart while you are sitting or lying down.  Take over-the-counter and prescription medicines only as told by your health care provider. SEEK MEDICAL CARE IF:  Your symptoms do not improve after several days of treatment.  Your symptoms get worse.  You have difficulty moving the injured area. SEEK IMMEDIATE MEDICAL CARE IF:   You have severe pain.  You have  numbness in a hand or foot.  Your hand or foot turns pale or cold.   This information is not intended to replace advice given to you by your health care provider. Make sure you discuss any questions you have with your health care provider.   Document Released: 05/16/2005 Document Revised: 04/27/2015 Document Reviewed: 12/22/2014 Elsevier Interactive Patient Education [employer].   I personally performed the services described in this documentation, which was scribed in my presence. The recorded information has been reviewed and considered, and addended by me as needed.

## 2015-10-17 ENCOUNTER — Encounter: Payer: Self-pay | Admitting: Internal Medicine

## 2015-10-19 DIAGNOSIS — M25511 Pain in right shoulder: Secondary | ICD-10-CM | POA: Diagnosis not present

## 2015-10-19 DIAGNOSIS — M25552 Pain in left hip: Secondary | ICD-10-CM | POA: Diagnosis not present

## 2015-10-19 MED ORDER — METFORMIN HCL ER (OSM) 1000 MG PO TB24: ORAL_TABLET | ORAL | Status: DC

## 2015-10-19 NOTE — Addendum Note (Signed)
Addended by: Leandrew Koyanagi on: 10/19/2015 10:46 AM   Modules accepted: Orders

## 2015-10-19 NOTE — Telephone Encounter (Signed)
Dose corrected Meds ordered this encounter  Medications  . metFORMIN (FORTAMET) 1000 MG (OSM) 24 hr tablet    Sig: Take 1 tablet by mouth two  times daily    Dispense:  180 tablet    Refill:  1

## 2015-10-24 ENCOUNTER — Other Ambulatory Visit: Payer: Self-pay

## 2015-10-24 MED ORDER — METFORMIN HCL ER 500 MG PO TB24: 1000.0000 mg | ORAL_TABLET | Freq: Two times a day (BID) | ORAL | Status: AC

## 2015-10-24 NOTE — Telephone Encounter (Signed)
Pharm sent notice that metf ER 1000 is not covered by ins and need change to 500 mg ER tabs. Re-sent Rx for the 500 mg to take 2 tabs BID to equal orig Rx in dose.

## 2015-11-16 ENCOUNTER — Other Ambulatory Visit: Payer: Self-pay

## 2015-11-16 MED ORDER — CLONAZEPAM 0.5 MG PO TABS: ORAL_TABLET | ORAL | Status: AC

## 2015-11-16 NOTE — Telephone Encounter (Signed)
Meds ordered this encounter  Medications  . clonazePAM (KLONOPIN) 0.5 MG tablet    Sig: Take one tablet by mouth at bedtime as needed    Dispense:  90 tablet    Refill:  1

## 2015-11-16 NOTE — Telephone Encounter (Signed)
Mail order pharm req's RFs of clonazepam. Pended.

## 2015-11-17 NOTE — Telephone Encounter (Signed)
Called in.

## 2015-11-25 DIAGNOSIS — Z1389 Encounter for screening for other disorder: Secondary | ICD-10-CM | POA: Diagnosis not present

## 2015-11-25 DIAGNOSIS — E119 Type 2 diabetes mellitus without complications: Secondary | ICD-10-CM | POA: Diagnosis not present

## 2015-11-25 DIAGNOSIS — G8929 Other chronic pain: Secondary | ICD-10-CM | POA: Diagnosis not present

## 2015-11-25 DIAGNOSIS — E782 Mixed hyperlipidemia: Secondary | ICD-10-CM | POA: Diagnosis not present

## 2015-11-25 DIAGNOSIS — I10 Essential (primary) hypertension: Secondary | ICD-10-CM | POA: Diagnosis not present

## 2015-12-07 DIAGNOSIS — N289 Disorder of kidney and ureter, unspecified: Secondary | ICD-10-CM | POA: Diagnosis not present

## 2015-12-07 DIAGNOSIS — N390 Urinary tract infection, site not specified: Secondary | ICD-10-CM | POA: Diagnosis not present

## 2015-12-07 DIAGNOSIS — I63521 Cerebral infarction due to unspecified occlusion or stenosis of right anterior cerebral artery: Secondary | ICD-10-CM | POA: Diagnosis not present

## 2015-12-07 DIAGNOSIS — Z7984 Long term (current) use of oral hypoglycemic drugs: Secondary | ICD-10-CM | POA: Diagnosis not present

## 2015-12-07 DIAGNOSIS — R4781 Slurred speech: Secondary | ICD-10-CM | POA: Diagnosis not present

## 2015-12-07 DIAGNOSIS — G9389 Other specified disorders of brain: Secondary | ICD-10-CM | POA: Diagnosis not present

## 2015-12-07 DIAGNOSIS — R9089 Other abnormal findings on diagnostic imaging of central nervous system: Secondary | ICD-10-CM | POA: Diagnosis not present

## 2015-12-07 DIAGNOSIS — A09 Infectious gastroenteritis and colitis, unspecified: Secondary | ICD-10-CM | POA: Diagnosis not present

## 2015-12-07 DIAGNOSIS — R4701 Aphasia: Secondary | ICD-10-CM | POA: Diagnosis not present

## 2015-12-07 DIAGNOSIS — M5416 Radiculopathy, lumbar region: Secondary | ICD-10-CM | POA: Diagnosis not present

## 2015-12-07 DIAGNOSIS — N189 Chronic kidney disease, unspecified: Secondary | ICD-10-CM | POA: Diagnosis not present

## 2015-12-07 DIAGNOSIS — R471 Dysarthria and anarthria: Secondary | ICD-10-CM | POA: Diagnosis not present

## 2015-12-07 DIAGNOSIS — G459 Transient cerebral ischemic attack, unspecified: Secondary | ICD-10-CM | POA: Diagnosis not present

## 2015-12-07 DIAGNOSIS — S0990XA Unspecified injury of head, initial encounter: Secondary | ICD-10-CM | POA: Diagnosis not present

## 2015-12-07 DIAGNOSIS — E860 Dehydration: Secondary | ICD-10-CM | POA: Diagnosis not present

## 2015-12-07 DIAGNOSIS — M5030 Other cervical disc degeneration, unspecified cervical region: Secondary | ICD-10-CM | POA: Diagnosis not present

## 2015-12-07 DIAGNOSIS — I672 Cerebral atherosclerosis: Secondary | ICD-10-CM | POA: Diagnosis not present

## 2015-12-07 DIAGNOSIS — R197 Diarrhea, unspecified: Secondary | ICD-10-CM | POA: Diagnosis not present

## 2015-12-07 DIAGNOSIS — G319 Degenerative disease of nervous system, unspecified: Secondary | ICD-10-CM | POA: Diagnosis not present

## 2015-12-07 DIAGNOSIS — T1490 Injury, unspecified: Secondary | ICD-10-CM | POA: Diagnosis not present

## 2015-12-07 DIAGNOSIS — I340 Nonrheumatic mitral (valve) insufficiency: Secondary | ICD-10-CM | POA: Diagnosis not present

## 2015-12-07 DIAGNOSIS — M47812 Spondylosis without myelopathy or radiculopathy, cervical region: Secondary | ICD-10-CM | POA: Diagnosis not present

## 2015-12-07 DIAGNOSIS — E1122 Type 2 diabetes mellitus with diabetic chronic kidney disease: Secondary | ICD-10-CM | POA: Diagnosis not present

## 2015-12-07 DIAGNOSIS — Z8673 Personal history of transient ischemic attack (TIA), and cerebral infarction without residual deficits: Secondary | ICD-10-CM | POA: Diagnosis not present

## 2015-12-07 DIAGNOSIS — Z043 Encounter for examination and observation following other accident: Secondary | ICD-10-CM | POA: Diagnosis not present

## 2015-12-07 DIAGNOSIS — S0093XA Contusion of unspecified part of head, initial encounter: Secondary | ICD-10-CM | POA: Diagnosis not present

## 2015-12-07 DIAGNOSIS — W19XXXA Unspecified fall, initial encounter: Secondary | ICD-10-CM | POA: Diagnosis not present

## 2015-12-07 DIAGNOSIS — I129 Hypertensive chronic kidney disease with stage 1 through stage 4 chronic kidney disease, or unspecified chronic kidney disease: Secondary | ICD-10-CM | POA: Diagnosis not present

## 2015-12-07 DIAGNOSIS — E785 Hyperlipidemia, unspecified: Secondary | ICD-10-CM | POA: Diagnosis not present

## 2015-12-07 DIAGNOSIS — R531 Weakness: Secondary | ICD-10-CM | POA: Diagnosis not present

## 2015-12-07 DIAGNOSIS — N179 Acute kidney failure, unspecified: Secondary | ICD-10-CM | POA: Diagnosis not present

## 2015-12-08 DIAGNOSIS — G459 Transient cerebral ischemic attack, unspecified: Secondary | ICD-10-CM | POA: Diagnosis not present

## 2015-12-08 DIAGNOSIS — Z043 Encounter for examination and observation following other accident: Secondary | ICD-10-CM | POA: Diagnosis not present

## 2015-12-08 DIAGNOSIS — N289 Disorder of kidney and ureter, unspecified: Secondary | ICD-10-CM | POA: Diagnosis not present

## 2015-12-08 DIAGNOSIS — I129 Hypertensive chronic kidney disease with stage 1 through stage 4 chronic kidney disease, or unspecified chronic kidney disease: Secondary | ICD-10-CM | POA: Diagnosis not present

## 2015-12-08 DIAGNOSIS — M5416 Radiculopathy, lumbar region: Secondary | ICD-10-CM | POA: Diagnosis not present

## 2015-12-08 DIAGNOSIS — R4701 Aphasia: Secondary | ICD-10-CM | POA: Diagnosis not present

## 2015-12-08 DIAGNOSIS — Z8673 Personal history of transient ischemic attack (TIA), and cerebral infarction without residual deficits: Secondary | ICD-10-CM | POA: Diagnosis not present

## 2015-12-08 DIAGNOSIS — E1122 Type 2 diabetes mellitus with diabetic chronic kidney disease: Secondary | ICD-10-CM | POA: Diagnosis not present

## 2015-12-08 DIAGNOSIS — R197 Diarrhea, unspecified: Secondary | ICD-10-CM | POA: Diagnosis not present

## 2015-12-08 DIAGNOSIS — S0990XA Unspecified injury of head, initial encounter: Secondary | ICD-10-CM | POA: Diagnosis not present

## 2015-12-08 DIAGNOSIS — R9089 Other abnormal findings on diagnostic imaging of central nervous system: Secondary | ICD-10-CM | POA: Diagnosis not present

## 2015-12-08 DIAGNOSIS — R471 Dysarthria and anarthria: Secondary | ICD-10-CM | POA: Diagnosis not present

## 2015-12-08 DIAGNOSIS — N189 Chronic kidney disease, unspecified: Secondary | ICD-10-CM | POA: Diagnosis not present

## 2015-12-08 DIAGNOSIS — E785 Hyperlipidemia, unspecified: Secondary | ICD-10-CM | POA: Diagnosis not present

## 2015-12-08 DIAGNOSIS — I63521 Cerebral infarction due to unspecified occlusion or stenosis of right anterior cerebral artery: Secondary | ICD-10-CM | POA: Diagnosis not present

## 2015-12-08 DIAGNOSIS — Z7984 Long term (current) use of oral hypoglycemic drugs: Secondary | ICD-10-CM | POA: Diagnosis not present

## 2015-12-08 DIAGNOSIS — G9389 Other specified disorders of brain: Secondary | ICD-10-CM | POA: Diagnosis not present

## 2015-12-08 DIAGNOSIS — N390 Urinary tract infection, site not specified: Secondary | ICD-10-CM | POA: Diagnosis not present

## 2015-12-08 DIAGNOSIS — E860 Dehydration: Secondary | ICD-10-CM | POA: Diagnosis not present

## 2015-12-14 DIAGNOSIS — N183 Chronic kidney disease, stage 3 (moderate): Secondary | ICD-10-CM | POA: Diagnosis not present

## 2015-12-14 DIAGNOSIS — Z8673 Personal history of transient ischemic attack (TIA), and cerebral infarction without residual deficits: Secondary | ICD-10-CM | POA: Diagnosis not present

## 2015-12-14 DIAGNOSIS — G458 Other transient cerebral ischemic attacks and related syndromes: Secondary | ICD-10-CM | POA: Diagnosis not present

## 2015-12-14 DIAGNOSIS — I519 Heart disease, unspecified: Secondary | ICD-10-CM | POA: Diagnosis not present

## 2015-12-14 DIAGNOSIS — E1122 Type 2 diabetes mellitus with diabetic chronic kidney disease: Secondary | ICD-10-CM | POA: Diagnosis not present

## 2015-12-15 DIAGNOSIS — I10 Essential (primary) hypertension: Secondary | ICD-10-CM | POA: Diagnosis not present

## 2015-12-15 DIAGNOSIS — G458 Other transient cerebral ischemic attacks and related syndromes: Secondary | ICD-10-CM | POA: Diagnosis not present

## 2015-12-15 DIAGNOSIS — E119 Type 2 diabetes mellitus without complications: Secondary | ICD-10-CM | POA: Diagnosis not present

## 2015-12-15 DIAGNOSIS — E782 Mixed hyperlipidemia: Secondary | ICD-10-CM | POA: Diagnosis not present

## 2015-12-15 DIAGNOSIS — E1122 Type 2 diabetes mellitus with diabetic chronic kidney disease: Secondary | ICD-10-CM | POA: Diagnosis not present

## 2015-12-20 ENCOUNTER — Other Ambulatory Visit: Payer: Self-pay | Admitting: Internal Medicine

## 2016-01-04 DIAGNOSIS — G458 Other transient cerebral ischemic attacks and related syndromes: Secondary | ICD-10-CM | POA: Diagnosis not present

## 2016-01-04 DIAGNOSIS — I10 Essential (primary) hypertension: Secondary | ICD-10-CM | POA: Diagnosis not present

## 2016-01-04 DIAGNOSIS — E119 Type 2 diabetes mellitus without complications: Secondary | ICD-10-CM | POA: Diagnosis not present

## 2016-01-04 DIAGNOSIS — E782 Mixed hyperlipidemia: Secondary | ICD-10-CM | POA: Diagnosis not present

## 2016-01-04 DIAGNOSIS — E1122 Type 2 diabetes mellitus with diabetic chronic kidney disease: Secondary | ICD-10-CM | POA: Diagnosis not present

## 2016-01-10 DIAGNOSIS — I10 Essential (primary) hypertension: Secondary | ICD-10-CM | POA: Diagnosis not present

## 2016-01-10 DIAGNOSIS — I5032 Chronic diastolic (congestive) heart failure: Secondary | ICD-10-CM | POA: Diagnosis not present

## 2016-01-10 DIAGNOSIS — E119 Type 2 diabetes mellitus without complications: Secondary | ICD-10-CM | POA: Diagnosis not present

## 2016-01-11 DIAGNOSIS — E1122 Type 2 diabetes mellitus with diabetic chronic kidney disease: Secondary | ICD-10-CM | POA: Diagnosis not present

## 2016-01-11 DIAGNOSIS — N183 Chronic kidney disease, stage 3 (moderate): Secondary | ICD-10-CM | POA: Diagnosis not present

## 2016-01-12 DIAGNOSIS — E876 Hypokalemia: Secondary | ICD-10-CM | POA: Diagnosis not present

## 2016-01-12 DIAGNOSIS — N183 Chronic kidney disease, stage 3 (moderate): Secondary | ICD-10-CM | POA: Diagnosis not present

## 2016-01-12 DIAGNOSIS — M5440 Lumbago with sciatica, unspecified side: Secondary | ICD-10-CM | POA: Diagnosis not present

## 2016-01-12 DIAGNOSIS — I10 Essential (primary) hypertension: Secondary | ICD-10-CM | POA: Diagnosis not present

## 2016-01-20 DIAGNOSIS — N183 Chronic kidney disease, stage 3 (moderate): Secondary | ICD-10-CM | POA: Diagnosis not present

## 2016-01-20 DIAGNOSIS — E1122 Type 2 diabetes mellitus with diabetic chronic kidney disease: Secondary | ICD-10-CM | POA: Diagnosis not present

## 2016-01-25 DIAGNOSIS — M545 Low back pain: Secondary | ICD-10-CM | POA: Diagnosis not present

## 2016-01-25 DIAGNOSIS — M25652 Stiffness of left hip, not elsewhere classified: Secondary | ICD-10-CM | POA: Diagnosis not present

## 2016-01-25 DIAGNOSIS — R2689 Other abnormalities of gait and mobility: Secondary | ICD-10-CM | POA: Diagnosis not present

## 2016-01-25 DIAGNOSIS — M6281 Muscle weakness (generalized): Secondary | ICD-10-CM | POA: Diagnosis not present

## 2016-01-26 DIAGNOSIS — H2513 Age-related nuclear cataract, bilateral: Secondary | ICD-10-CM | POA: Diagnosis not present

## 2016-01-27 DIAGNOSIS — M25652 Stiffness of left hip, not elsewhere classified: Secondary | ICD-10-CM | POA: Diagnosis not present

## 2016-01-27 DIAGNOSIS — M6281 Muscle weakness (generalized): Secondary | ICD-10-CM | POA: Diagnosis not present

## 2016-01-27 DIAGNOSIS — M545 Low back pain: Secondary | ICD-10-CM | POA: Diagnosis not present

## 2016-01-30 IMAGING — CT CT ABD-PELV W/O CM
2 of 4 series · 4 of 46 positions shown, 6 images · non-contrast
Comparison: None.

CLINICAL DATA: Abdominal pain.  Check for hydronephrosis.

EXAM:
CT ABDOMEN AND PELVIS WITHOUT CONTRAST
TECHNIQUE: Multidetector CT imaging of the abdomen and pelvis was performed
following the standard protocol without IV contrast.

[Series 206: coronals · coronal · 0.50mm/px · 3 of 107 slices shown, 4 images]
[im 24/107  soft-tissue]
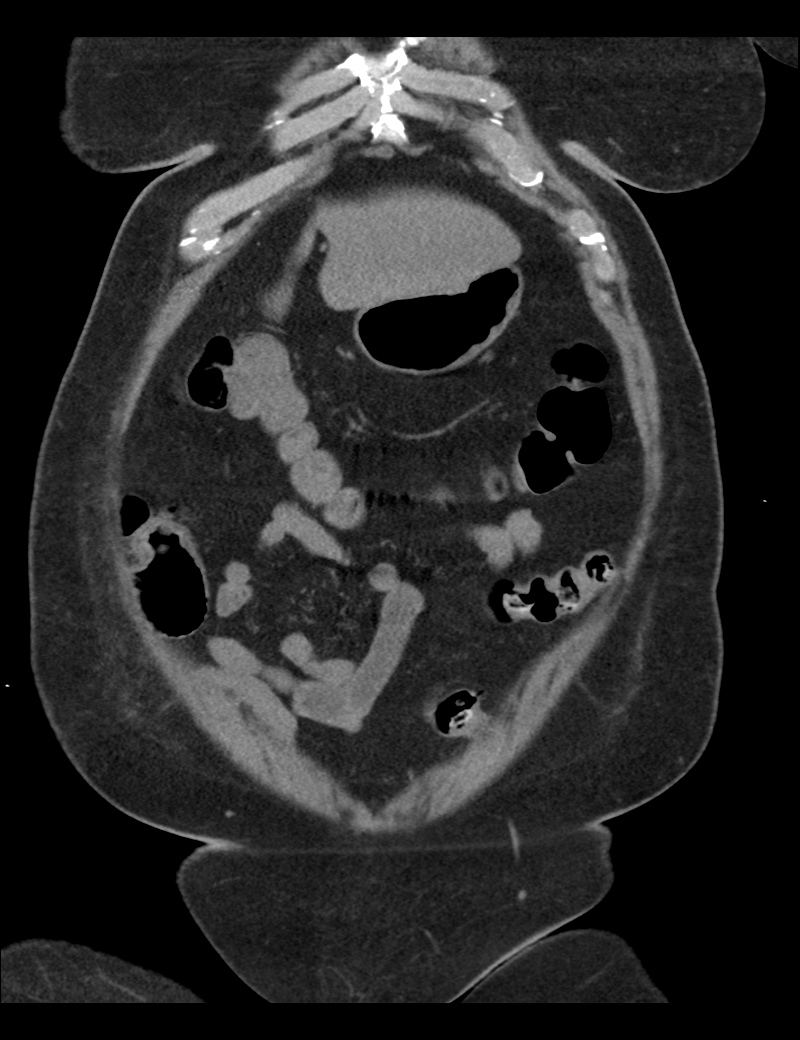
[im 24/107  bone]
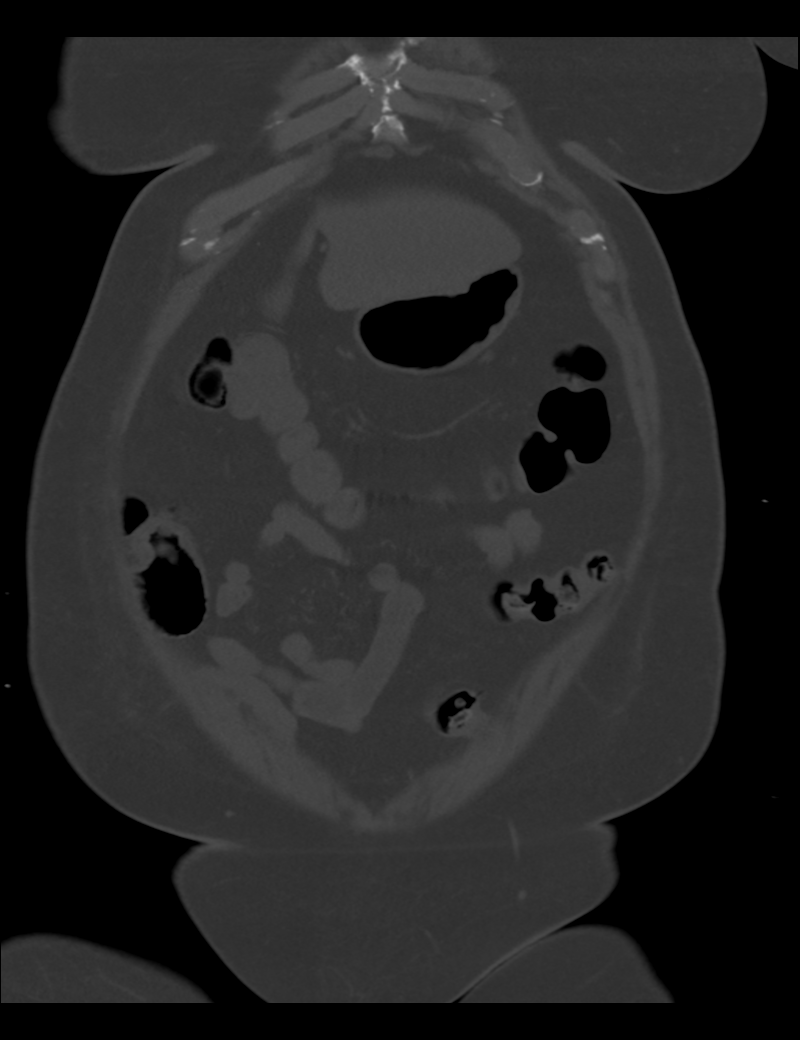
[im 59/107  soft-tissue]
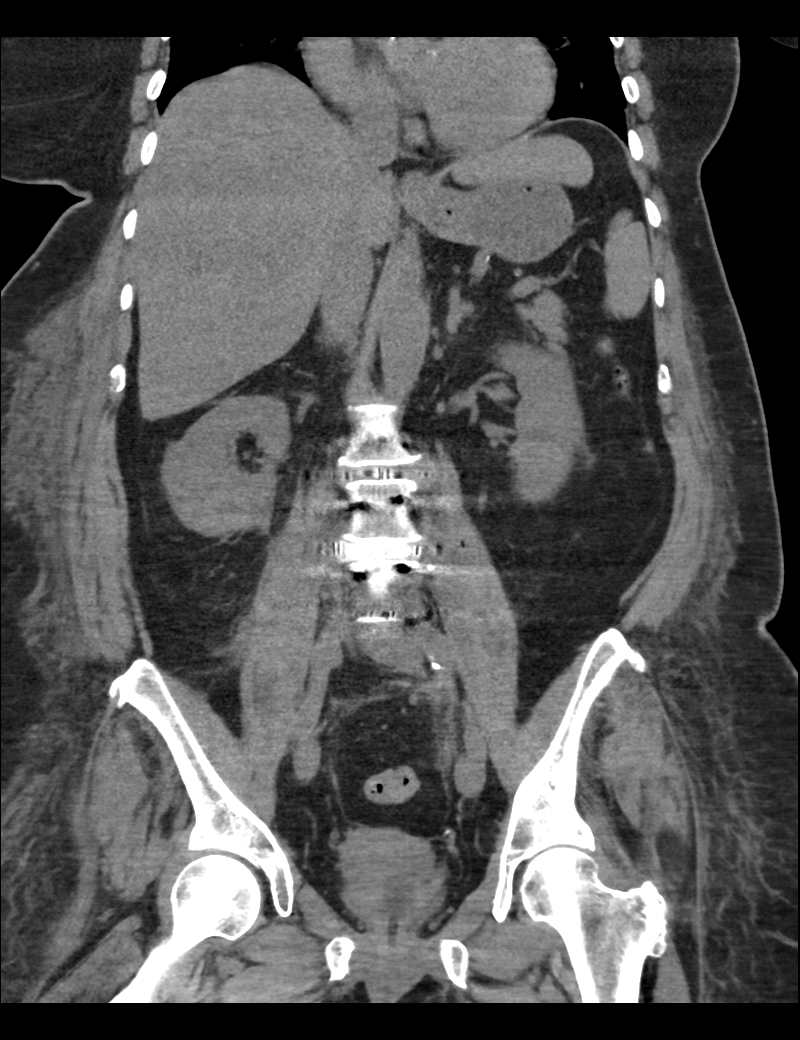
[im 83/107  soft-tissue]
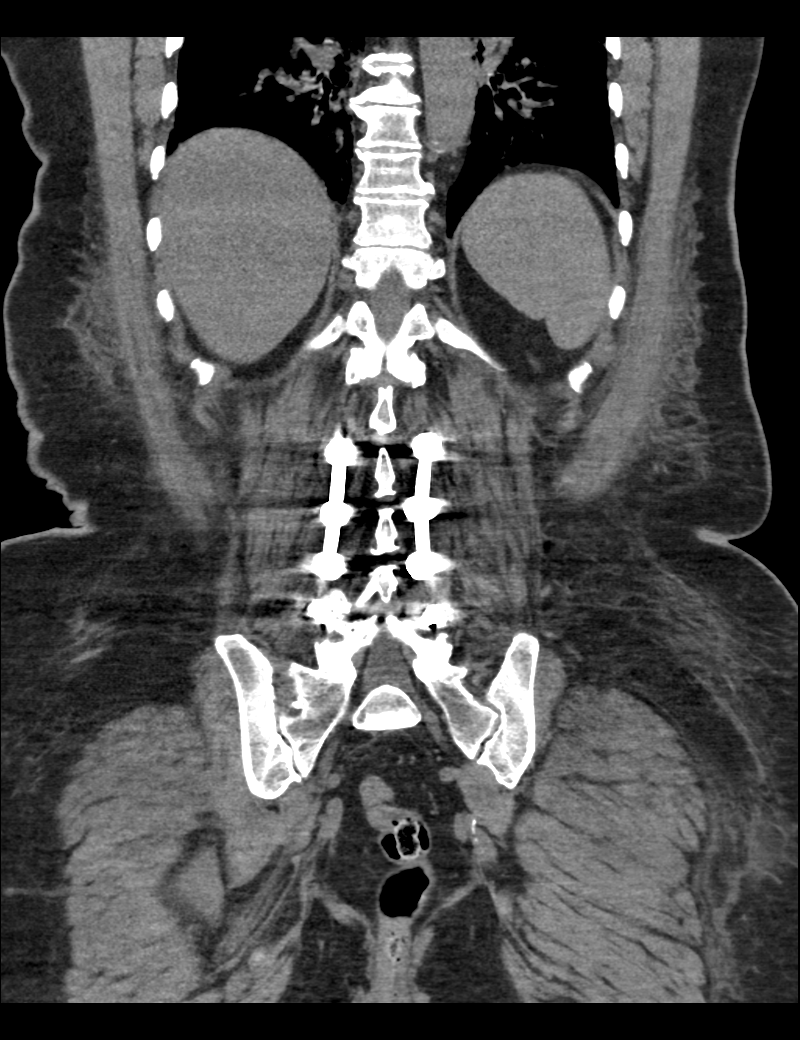

[Series 207: sagittals · sagittal · 0.50mm/px · 1 of 134 slices shown, 2 images]
[im 45/134  soft-tissue]
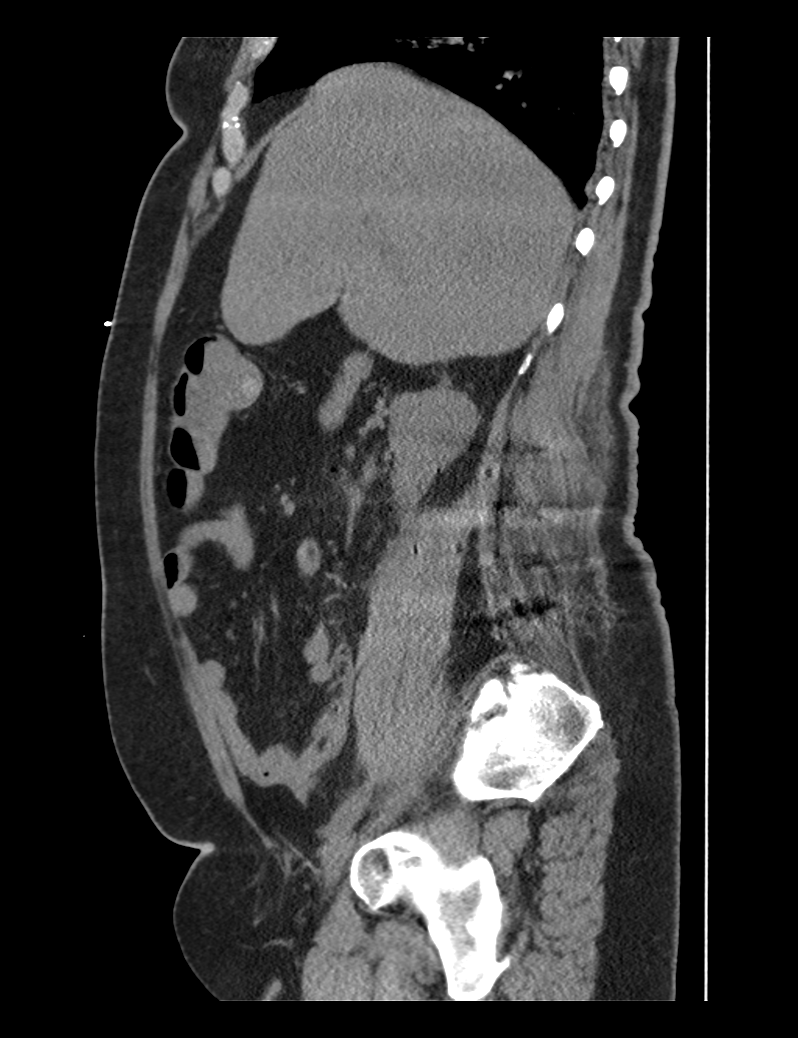
[im 45/134  bone]
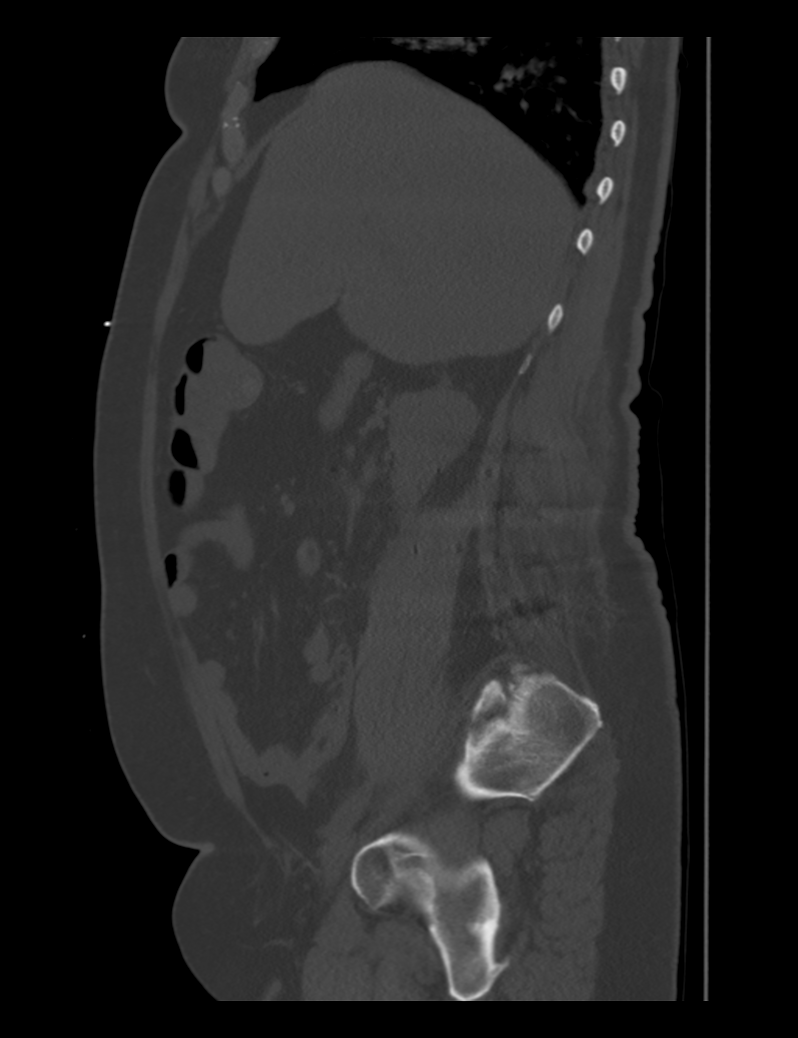

[4 of 46 positions shown; findings below may reference images not displayed]

FINDINGS: BODY WALL: Body wall edema, more extensive on the right after
surgery.

LOWER CHEST: Borderline cardiomegaly. Bandlike opacities in the
lower lungs consistent with atelectasis.

ABDOMEN/PELVIS:

Liver: No focal abnormality.

Biliary: Cholecystectomy.

Pancreas: Unremarkable.

Spleen: Unremarkable.

Adrenals: Unremarkable.

Kidneys and ureters: No hydronephrosis or stone.

Bladder: Completely decompressed by Foley catheter.

Reproductive: The right ovary is difficult to discern due to
neighboring bowel loops.

Bowel: No obstruction. Negative appendix.

Retroperitoneum: Edema in the retroperitoneum centered around the
postoperative lumbar spine levels. These changes are fairly
symmetric, involving the fat and bilateral psoas musculature. There
are few scattered locules of gas. No evidence of fluid collection.
Minimal herniation of retroperitoneal fat in the region of the right
flank incision.

Peritoneum: No ascites or pneumoperitoneum.

Vascular: Abdominal aortic atherosclerosis. Edema surrounds the
lower cava and aorta, without evidence of hematoma or aneurysm.

OSSEOUS: Interval L2-3, L3-4, and L4-5 anterior lateral discectomy.
There has also been posterior fusion using rod and pedicle screws at
the same levels. Interbody cages are stable compared to
intraoperative fluoroscopy. No evidence of fixation hardware
displacement. No acute fracture.
IMPRESSION: 1. No hydronephrosis or urinary bladder retention.
2. Expected postoperative changes after L2-3, L3-4, L4-5 discectomy
and fixation.

## 2016-01-30 IMAGING — CT CT HEAD W/O CM
2 series · 16 of 30 positions shown, 20 images · non-contrast
Comparison: 07/03/2013

CLINICAL DATA: Confusion.

EXAM:
CT HEAD WITHOUT CONTRAST
TECHNIQUE: Contiguous axial images were obtained from the base of the skull
through the vertex without intravenous contrast.

[Series 201: head w/o, idose (1) · axial · non-contrast · 0.41mm/px · z∈[+84,+214]mm · 13 of 32 slices shown, 17 images]
[im 3/32  brain]
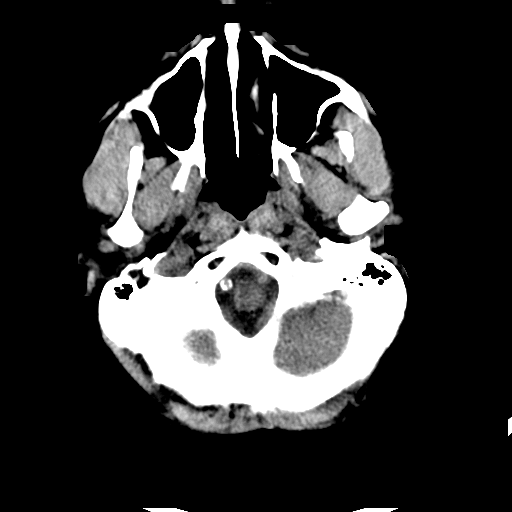
[im 3/32  bone]
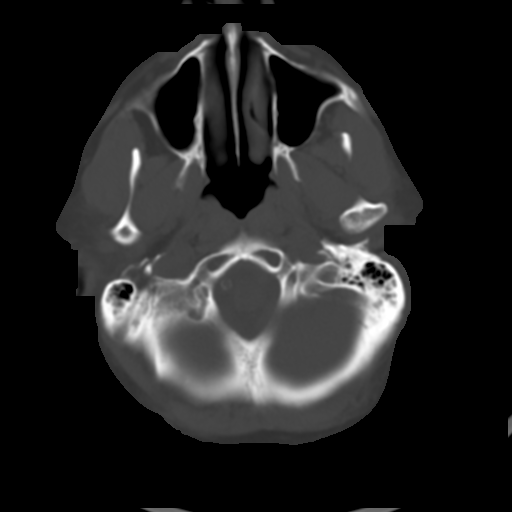
[im 5/32  brain]
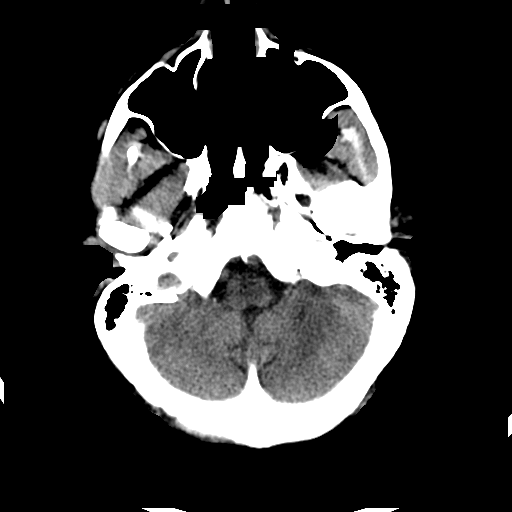
[im 7/32  brain]
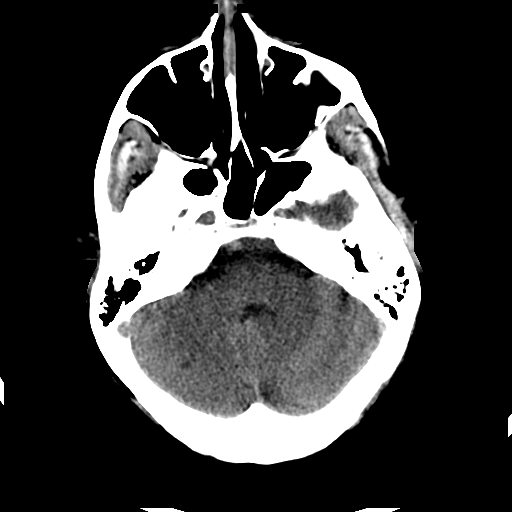
[im 9/32  brain]
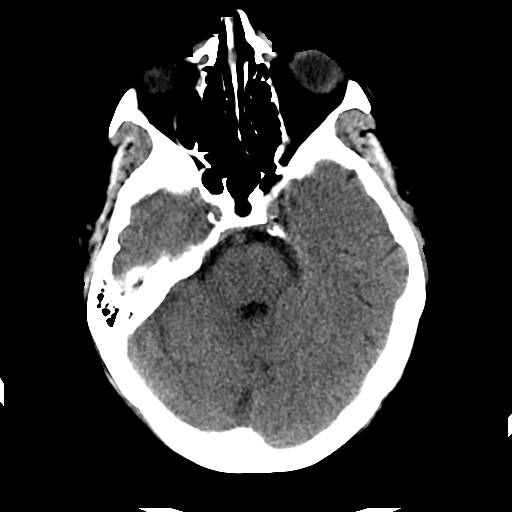
[im 12/32  brain]
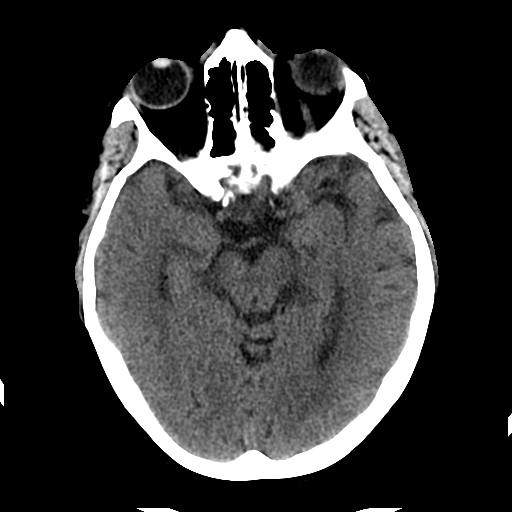
[im 12/32  bone]
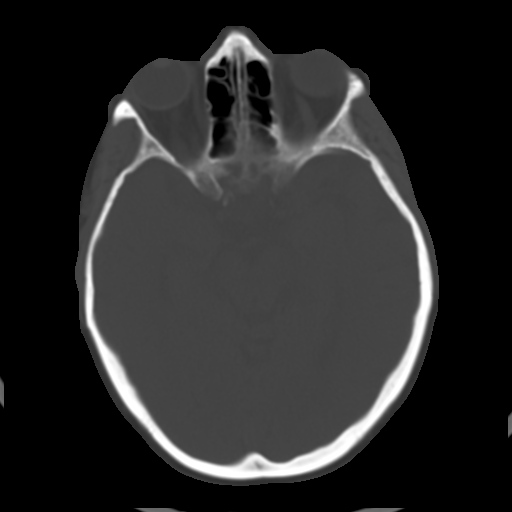
[im 14/32  brain]
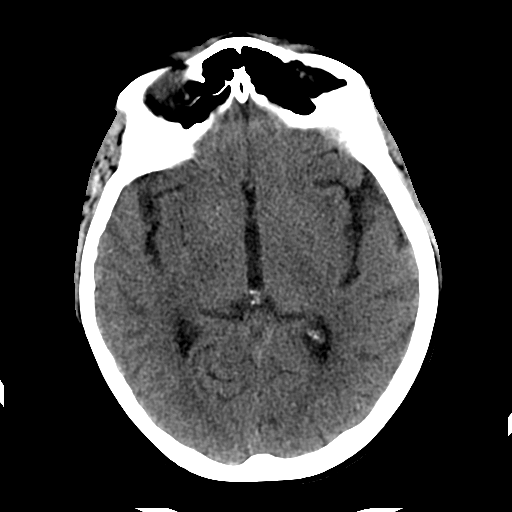
[im 16/32  brain]
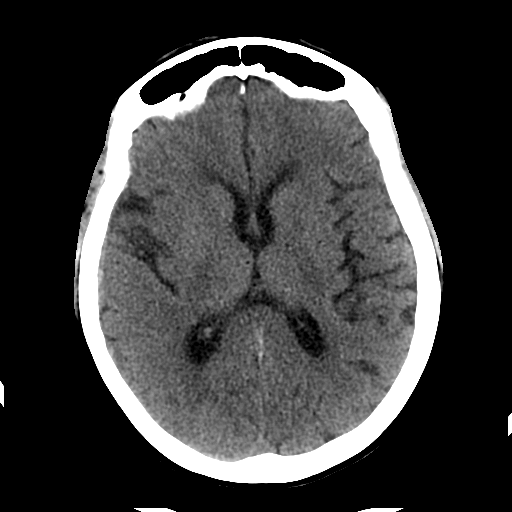
[im 18/32  brain]
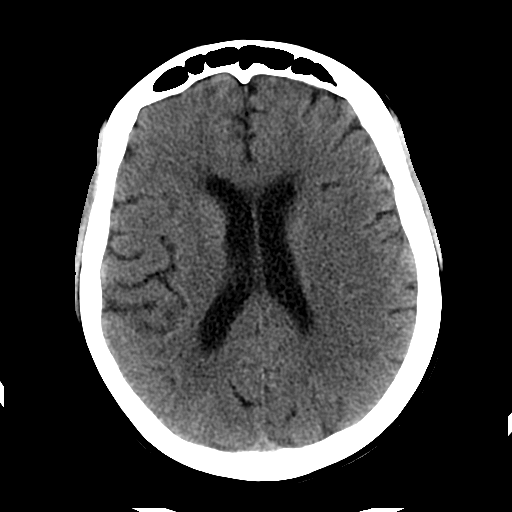
[im 20/32  brain]
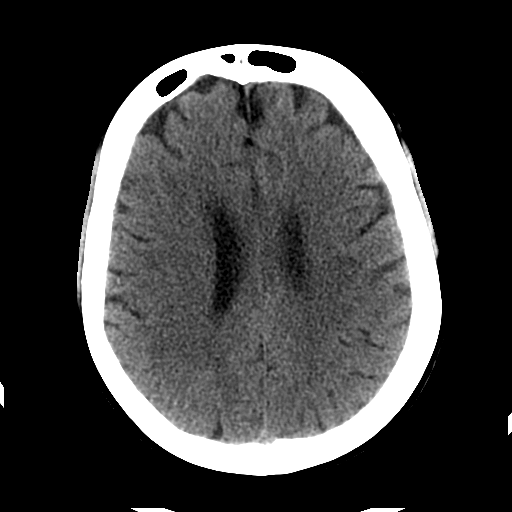
[im 20/32  bone]
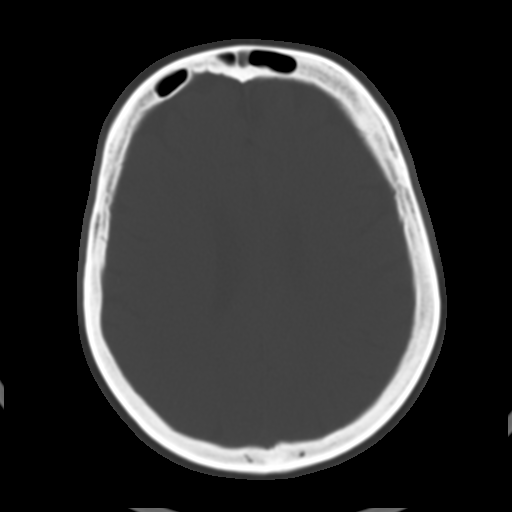
[im 23/32  brain]
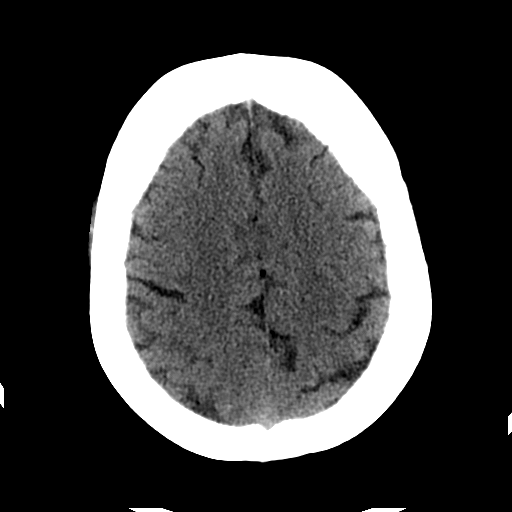
[im 25/32  brain]
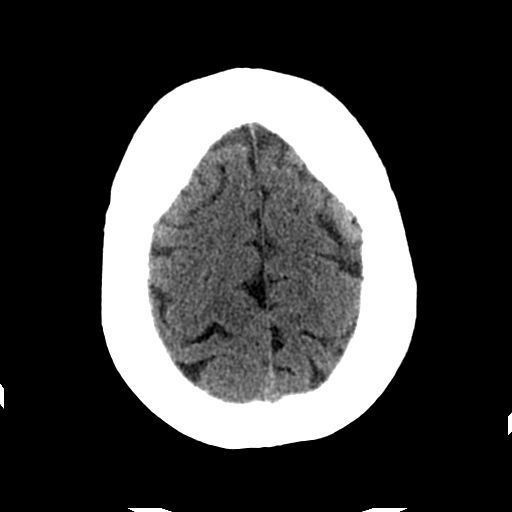
[im 27/32  brain]
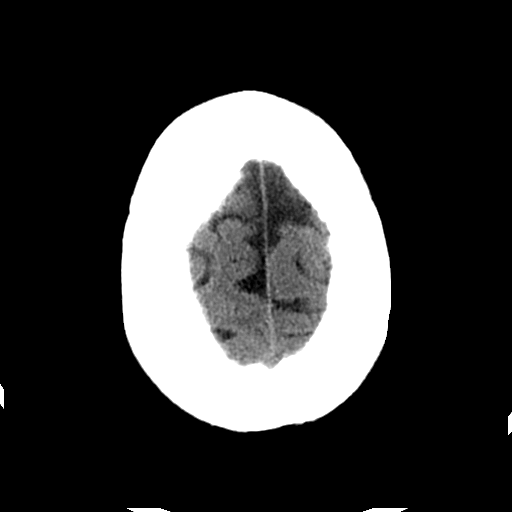
[im 29/32  brain]
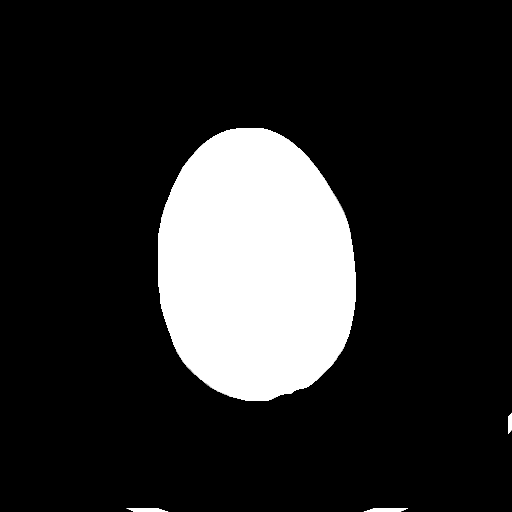
[im 29/32  bone]
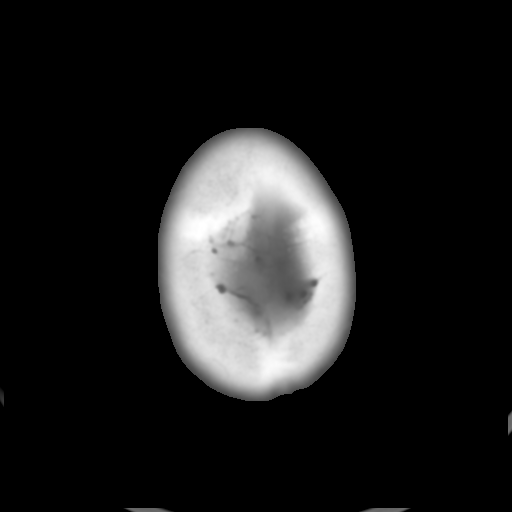

[Series 202: head w/o bone, idose (1) · axial · non-contrast · 0.41mm/px · z∈[+84,+129]mm · 3 of 32 slices shown]
[im 3/32  bone]
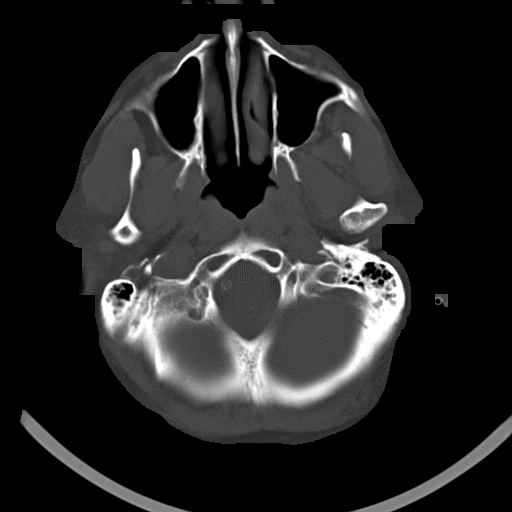
[im 7/32  bone]
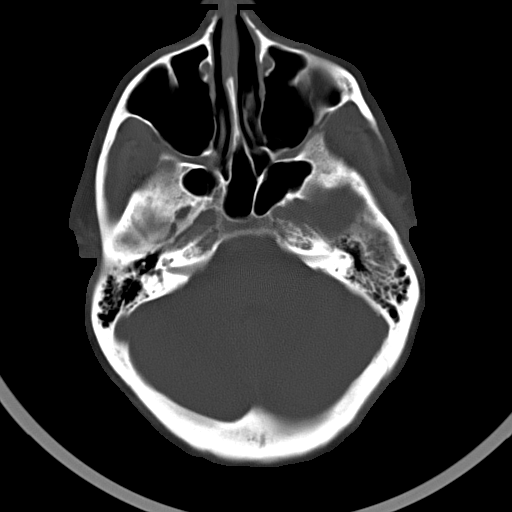
[im 12/32  bone]
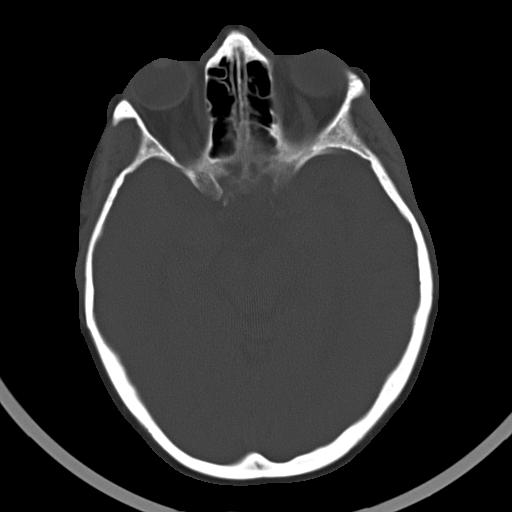

[16 of 30 positions shown; findings below may reference images not displayed]

FINDINGS: Skull and Sinuses:Negative for fracture or destructive process. The
mastoids, middle ears, and imaged paranasal sinuses are clear.

Orbits: No acute abnormality.

Brain: No evidence of acute abnormality, such as acute infarction,
hemorrhage, hydrocephalus, or mass lesion/mass effect. There is
chronic small vessel disease with ischemic gliosis noted around the
lateral ventricles, especially the frontal horns. Remote small
vessel infarct in the right cerebellum. Generalized cerebral volume
loss which is age appropriate.
IMPRESSION: No acute intracranial abnormality.

## 2016-01-30 IMAGING — CR DG CHEST 1V PORT
1 series · 1 of 1 positions shown · non-contrast
Comparison: 02/28/2014.

CLINICAL DATA: Central line insertion.

EXAM:
PORTABLE CHEST - 1 VIEW

[AP]
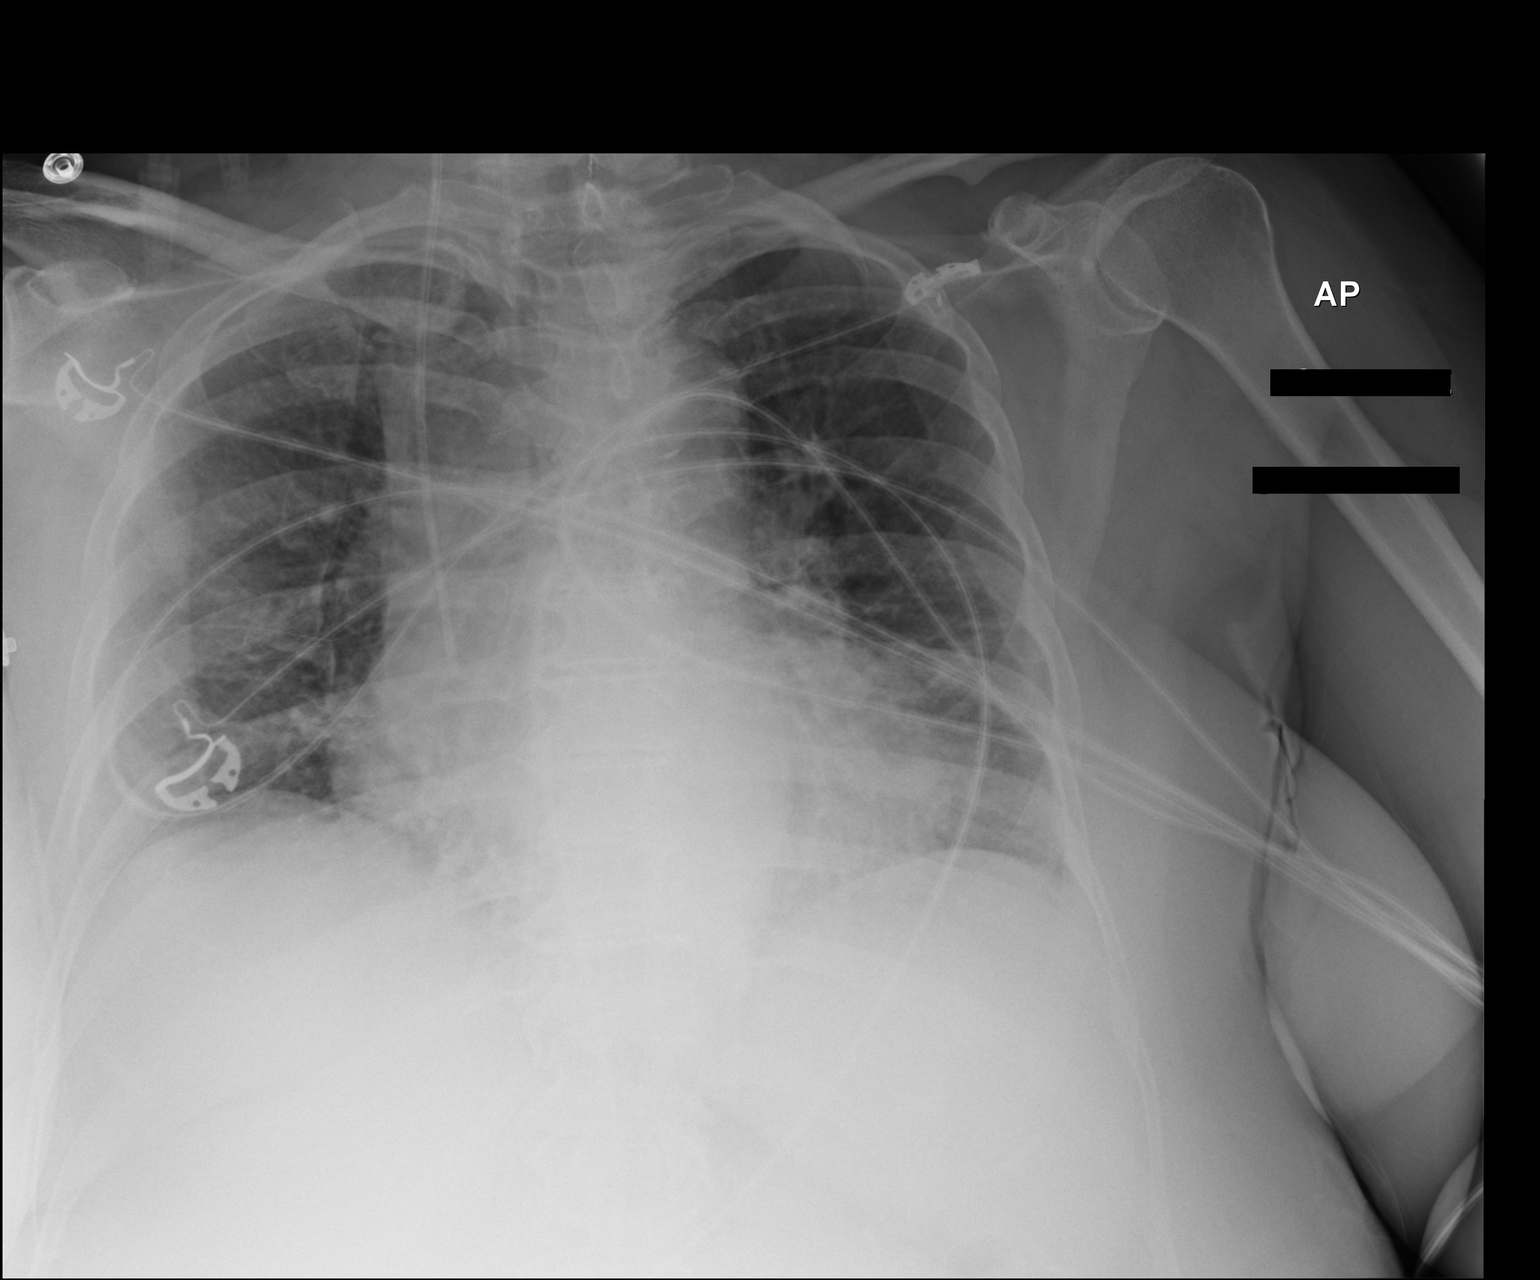

[1 of 1 positions shown; findings below may reference images not displayed]

FINDINGS: Unchanged cardiopericardial enlargement. There is coarsening of lung
markings at the bases, atelectasis based on recent abdominal CT.
Widening of the upper mediastinum which is likely positional.

New right IJ central line, tip at the upper cavoatrial junction. No
evidence of pneumothorax or increasing pleural fluid.
IMPRESSION: New right IJ central line.  No adverse features.

## 2016-02-01 DIAGNOSIS — M25652 Stiffness of left hip, not elsewhere classified: Secondary | ICD-10-CM | POA: Diagnosis not present

## 2016-02-01 DIAGNOSIS — M545 Low back pain: Secondary | ICD-10-CM | POA: Diagnosis not present

## 2016-02-01 DIAGNOSIS — N183 Chronic kidney disease, stage 3 (moderate): Secondary | ICD-10-CM | POA: Diagnosis not present

## 2016-02-01 DIAGNOSIS — M6281 Muscle weakness (generalized): Secondary | ICD-10-CM | POA: Diagnosis not present

## 2016-02-01 DIAGNOSIS — E876 Hypokalemia: Secondary | ICD-10-CM | POA: Diagnosis not present

## 2016-02-01 DIAGNOSIS — R2689 Other abnormalities of gait and mobility: Secondary | ICD-10-CM | POA: Diagnosis not present

## 2016-02-01 DIAGNOSIS — E1122 Type 2 diabetes mellitus with diabetic chronic kidney disease: Secondary | ICD-10-CM | POA: Diagnosis not present

## 2016-02-03 DIAGNOSIS — M6281 Muscle weakness (generalized): Secondary | ICD-10-CM | POA: Diagnosis not present

## 2016-02-03 DIAGNOSIS — M25652 Stiffness of left hip, not elsewhere classified: Secondary | ICD-10-CM | POA: Diagnosis not present

## 2016-02-03 DIAGNOSIS — E1122 Type 2 diabetes mellitus with diabetic chronic kidney disease: Secondary | ICD-10-CM | POA: Diagnosis not present

## 2016-02-08 DIAGNOSIS — M25652 Stiffness of left hip, not elsewhere classified: Secondary | ICD-10-CM | POA: Diagnosis not present

## 2016-02-08 DIAGNOSIS — M6281 Muscle weakness (generalized): Secondary | ICD-10-CM | POA: Diagnosis not present

## 2016-02-08 DIAGNOSIS — R2689 Other abnormalities of gait and mobility: Secondary | ICD-10-CM | POA: Diagnosis not present

## 2016-02-08 DIAGNOSIS — M545 Low back pain: Secondary | ICD-10-CM | POA: Diagnosis not present

## 2016-02-10 DIAGNOSIS — M545 Low back pain: Secondary | ICD-10-CM | POA: Diagnosis not present

## 2016-02-10 DIAGNOSIS — M6281 Muscle weakness (generalized): Secondary | ICD-10-CM | POA: Diagnosis not present

## 2016-02-10 DIAGNOSIS — M25652 Stiffness of left hip, not elsewhere classified: Secondary | ICD-10-CM | POA: Diagnosis not present

## 2016-02-10 DIAGNOSIS — R2689 Other abnormalities of gait and mobility: Secondary | ICD-10-CM | POA: Diagnosis not present

## 2016-02-14 DIAGNOSIS — M6281 Muscle weakness (generalized): Secondary | ICD-10-CM | POA: Diagnosis not present

## 2016-02-14 DIAGNOSIS — M545 Low back pain: Secondary | ICD-10-CM | POA: Diagnosis not present

## 2016-02-14 DIAGNOSIS — R2689 Other abnormalities of gait and mobility: Secondary | ICD-10-CM | POA: Diagnosis not present

## 2016-02-14 DIAGNOSIS — M25652 Stiffness of left hip, not elsewhere classified: Secondary | ICD-10-CM | POA: Diagnosis not present

## 2016-02-16 DIAGNOSIS — E782 Mixed hyperlipidemia: Secondary | ICD-10-CM | POA: Diagnosis not present

## 2016-02-16 DIAGNOSIS — E1122 Type 2 diabetes mellitus with diabetic chronic kidney disease: Secondary | ICD-10-CM | POA: Diagnosis not present

## 2016-02-16 DIAGNOSIS — I10 Essential (primary) hypertension: Secondary | ICD-10-CM | POA: Diagnosis not present

## 2016-02-17 DIAGNOSIS — R2689 Other abnormalities of gait and mobility: Secondary | ICD-10-CM | POA: Diagnosis not present

## 2016-02-17 DIAGNOSIS — E119 Type 2 diabetes mellitus without complications: Secondary | ICD-10-CM | POA: Diagnosis not present

## 2016-02-17 DIAGNOSIS — E1122 Type 2 diabetes mellitus with diabetic chronic kidney disease: Secondary | ICD-10-CM | POA: Diagnosis not present

## 2016-02-17 DIAGNOSIS — M25652 Stiffness of left hip, not elsewhere classified: Secondary | ICD-10-CM | POA: Diagnosis not present

## 2016-02-17 DIAGNOSIS — G458 Other transient cerebral ischemic attacks and related syndromes: Secondary | ICD-10-CM | POA: Diagnosis not present

## 2016-02-17 DIAGNOSIS — M545 Low back pain: Secondary | ICD-10-CM | POA: Diagnosis not present

## 2016-02-17 DIAGNOSIS — E782 Mixed hyperlipidemia: Secondary | ICD-10-CM | POA: Diagnosis not present

## 2016-02-17 DIAGNOSIS — I10 Essential (primary) hypertension: Secondary | ICD-10-CM | POA: Diagnosis not present

## 2016-02-17 DIAGNOSIS — M6281 Muscle weakness (generalized): Secondary | ICD-10-CM | POA: Diagnosis not present

## 2016-02-20 DIAGNOSIS — E119 Type 2 diabetes mellitus without complications: Secondary | ICD-10-CM | POA: Diagnosis not present

## 2016-02-20 DIAGNOSIS — E1122 Type 2 diabetes mellitus with diabetic chronic kidney disease: Secondary | ICD-10-CM | POA: Diagnosis not present

## 2016-02-20 DIAGNOSIS — E782 Mixed hyperlipidemia: Secondary | ICD-10-CM | POA: Diagnosis not present

## 2016-02-20 DIAGNOSIS — I10 Essential (primary) hypertension: Secondary | ICD-10-CM | POA: Diagnosis not present

## 2016-02-20 DIAGNOSIS — G458 Other transient cerebral ischemic attacks and related syndromes: Secondary | ICD-10-CM | POA: Diagnosis not present

## 2016-03-27 DIAGNOSIS — I10 Essential (primary) hypertension: Secondary | ICD-10-CM | POA: Diagnosis not present

## 2016-04-02 DIAGNOSIS — I10 Essential (primary) hypertension: Secondary | ICD-10-CM | POA: Diagnosis not present

## 2016-04-02 DIAGNOSIS — I5032 Chronic diastolic (congestive) heart failure: Secondary | ICD-10-CM | POA: Diagnosis not present

## 2016-04-03 DIAGNOSIS — E1122 Type 2 diabetes mellitus with diabetic chronic kidney disease: Secondary | ICD-10-CM | POA: Diagnosis not present

## 2016-04-03 DIAGNOSIS — E782 Mixed hyperlipidemia: Secondary | ICD-10-CM | POA: Diagnosis not present

## 2016-04-03 DIAGNOSIS — I10 Essential (primary) hypertension: Secondary | ICD-10-CM | POA: Diagnosis not present

## 2016-04-03 DIAGNOSIS — E119 Type 2 diabetes mellitus without complications: Secondary | ICD-10-CM | POA: Diagnosis not present

## 2016-04-03 DIAGNOSIS — G458 Other transient cerebral ischemic attacks and related syndromes: Secondary | ICD-10-CM | POA: Diagnosis not present

## 2016-04-05 DIAGNOSIS — M160 Bilateral primary osteoarthritis of hip: Secondary | ICD-10-CM | POA: Diagnosis not present

## 2016-04-05 DIAGNOSIS — N183 Chronic kidney disease, stage 3 (moderate): Secondary | ICD-10-CM | POA: Diagnosis not present

## 2016-05-03 ENCOUNTER — Other Ambulatory Visit: Payer: Self-pay

## 2016-05-03 MED ORDER — ESCITALOPRAM OXALATE 20 MG PO TABS: 20.0000 mg | ORAL_TABLET | Freq: Every day | ORAL | 0 refills | Status: AC

## 2016-05-03 NOTE — Telephone Encounter (Signed)
Fax request for refil: Lexapro Tab 20mg .  Sig: 1 tablet by mouth daily.

## 2016-06-14 ENCOUNTER — Other Ambulatory Visit: Payer: Self-pay

## 2016-06-14 MED ORDER — LEVOTHYROXINE SODIUM 150 MCG PO TABS: ORAL_TABLET | ORAL | 0 refills | Status: AC

## 2016-07-06 ENCOUNTER — Other Ambulatory Visit: Payer: Self-pay

## 2016-07-06 MED ORDER — BUPROPION HCL ER (XL) 300 MG PO TB24: ORAL_TABLET | ORAL | 0 refills | Status: AC
